# Patient Record
Sex: Female | Born: 1965 | Race: White | Hispanic: No | Smoking: Current every day smoker
Health system: Southern US, Community
[De-identification: ages and names within clinical notes are randomized; demographics above are authoritative.]

## PROBLEM LIST (undated history)

## (undated) DIAGNOSIS — F32A Depression, unspecified: Secondary | ICD-10-CM

## (undated) DIAGNOSIS — F329 Major depressive disorder, single episode, unspecified: Secondary | ICD-10-CM

## (undated) DIAGNOSIS — Z9109 Other allergy status, other than to drugs and biological substances: Secondary | ICD-10-CM

## (undated) DIAGNOSIS — K439 Ventral hernia without obstruction or gangrene: Secondary | ICD-10-CM

## (undated) DIAGNOSIS — F17200 Nicotine dependence, unspecified, uncomplicated: Secondary | ICD-10-CM

## (undated) HISTORY — PX: TONSILLECTOMY: SUR1361

## (undated) HISTORY — PX: BACK SURGERY: SHX140

---

## 2000-02-04 ENCOUNTER — Ambulatory Visit (HOSPITAL_BASED_OUTPATIENT_CLINIC_OR_DEPARTMENT_OTHER): Admission: RE | Admit: 2000-02-04 | Discharge: 2000-02-04 | Payer: Self-pay | Admitting: Orthopedic Surgery

## 2001-11-11 ENCOUNTER — Encounter: Admission: RE | Admit: 2001-11-11 | Discharge: 2001-11-11 | Payer: Self-pay | Admitting: Family Medicine

## 2001-11-11 ENCOUNTER — Encounter: Payer: Self-pay | Admitting: Family Medicine

## 2003-03-13 ENCOUNTER — Encounter: Payer: Self-pay | Admitting: Family Medicine

## 2003-03-13 ENCOUNTER — Ambulatory Visit (HOSPITAL_COMMUNITY): Admission: RE | Admit: 2003-03-13 | Discharge: 2003-03-13 | Payer: Self-pay | Admitting: Family Medicine

## 2003-11-29 ENCOUNTER — Emergency Department (HOSPITAL_COMMUNITY): Admission: EM | Admit: 2003-11-29 | Discharge: 2003-11-29 | Payer: Self-pay | Admitting: Emergency Medicine

## 2005-01-23 ENCOUNTER — Ambulatory Visit (HOSPITAL_COMMUNITY): Admission: RE | Admit: 2005-01-23 | Discharge: 2005-01-23 | Payer: Self-pay | Admitting: Family Medicine

## 2006-09-15 ENCOUNTER — Other Ambulatory Visit: Admission: RE | Admit: 2006-09-15 | Discharge: 2006-09-15 | Payer: Self-pay | Admitting: Obstetrics and Gynecology

## 2010-05-14 ENCOUNTER — Ambulatory Visit (HOSPITAL_COMMUNITY): Admission: RE | Admit: 2010-05-14 | Discharge: 2010-05-14 | Payer: Self-pay | Admitting: Family Medicine

## 2010-08-28 ENCOUNTER — Emergency Department (HOSPITAL_COMMUNITY)
Admission: EM | Admit: 2010-08-28 | Discharge: 2010-08-28 | Payer: Self-pay | Source: Home / Self Care | Admitting: Emergency Medicine

## 2011-03-06 LAB — DIFFERENTIAL
Basophils Relative: 0 % (ref 0–1)
Eosinophils Relative: 0 % (ref 0–5)
Lymphocytes Relative: 13 % (ref 12–46)
Neutrophils Relative %: 82 % — ABNORMAL HIGH (ref 43–77)

## 2011-03-06 LAB — CBC
Hemoglobin: 15.5 g/dL — ABNORMAL HIGH (ref 12.0–15.0)
MCV: 100.3 fL — ABNORMAL HIGH (ref 78.0–100.0)
RDW: 13.3 % (ref 11.5–15.5)

## 2011-03-06 LAB — BASIC METABOLIC PANEL
BUN: 4 mg/dL — ABNORMAL LOW (ref 6–23)
CO2: 25 mEq/L (ref 19–32)
Calcium: 9.6 mg/dL (ref 8.4–10.5)
Chloride: 102 mEq/L (ref 96–112)
Glucose, Bld: 115 mg/dL — ABNORMAL HIGH (ref 70–99)
Potassium: 3.7 mEq/L (ref 3.5–5.1)
Sodium: 136 mEq/L (ref 135–145)

## 2011-05-09 NOTE — Op Note (Signed)
[facility]  Patient:    Deborah Walton, Deborah Walton                     MRN: [id number] Proc. Date: 02/04/00 Adm. Date:  [id number] Attending:  Sypher, Douglass Rivers CC:         Katy Fitch. Sypher, Montez Hageman., M.D. (2)                           Operative Report  PREOPERATIVE DIAGNOSIS:  Left long finger A-2 ganglion pulley.  POSTOPERATIVE DIAGNOSIS:  Left long finger A-2 ganglion pulley.  OPERATION PERFORMED:  Excision of left long finger A-2 ganglion cyst.  SURGEON:  Katy Fitch. Sypher, Montez Hageman., M.D.  ASSISTANT:  Jaquelyn Bitter. Chabon, P.A.  ANESTHESIA:  0.25% Marcaine and 2% lidocaine metacarpal head level block, left ong finger supplemented by IV sedation.  SUPERVISING ANESTHESIOLOGIST:  Dr. Katrinka Blazing.  INDICATIONS FOR PROCEDURE:  The patient is a 45 year old woman who had noted an  enlarging mass along the palmar surface of her left long finger adjacent to the  webspace with the ring finger.  This was a source of pain and impaired prehension. She requested excisional biopsy.  DESCRIPTION OF PROCEDURE:  Deborah Walton was brought to the operating room and  placed in supine position on the operating table.  Following light sedation, the left arm was prepped with Betadine soap and solution and sterilely draped. Following exsanguination of the limb with an Esmarch bandage, the arterial tourniquet was inflated to 225 mmHg.  2% lidocaine and 0.25% Marcaine were infiltrated at the metacarpal head level.  When anesthesia was satisfactory, and oblique incision was fashioned in the line of a Brunner zigzag incision directly over the mass.  Subcutaneous tissues were carefully divided taking care to identify and gently retract the ulnar proper digital nerve of the long finger.  The cyst was circumferentially dissected off the flexor sheath and removed with a small portion of the sheath.  A rongeur was used to clean all synovial tissues off the sheath.  Bipolar  electrocautery was utilized to obtain hemostasis.  The wound was repaired with interrupted sutures of 5-0 nylon.  A compressive dressing was applied with Xeroflo, sterile gauze and Coban.  There were no apparent complications.  For aftercare, Ms. Paschal is given a prescription for Darvocet-N 100 one or two tables p.o. q.4-6h. p.r.n. pain.  20 tablets without refill. DD:  02/04/00 TD:  02/04/00 Job: [id number]

## 2011-07-07 DIAGNOSIS — F17200 Nicotine dependence, unspecified, uncomplicated: Secondary | ICD-10-CM

## 2011-07-07 HISTORY — DX: Nicotine dependence, unspecified, uncomplicated: F17.200

## 2013-07-06 ENCOUNTER — Encounter (HOSPITAL_COMMUNITY): Payer: Self-pay | Admitting: *Deleted

## 2013-07-06 ENCOUNTER — Emergency Department (HOSPITAL_COMMUNITY)
Admission: EM | Admit: 2013-07-06 | Discharge: 2013-07-06 | Disposition: A | Discharge status: Home / Self Care | Payer: Self-pay | Attending: Emergency Medicine | Admitting: Emergency Medicine

## 2013-07-06 ENCOUNTER — Emergency Department (HOSPITAL_COMMUNITY): Payer: Self-pay

## 2013-07-06 DIAGNOSIS — Z8719 Personal history of other diseases of the digestive system: Secondary | ICD-10-CM | POA: Insufficient documentation

## 2013-07-06 DIAGNOSIS — R109 Unspecified abdominal pain: Secondary | ICD-10-CM

## 2013-07-06 DIAGNOSIS — Z79899 Other long term (current) drug therapy: Secondary | ICD-10-CM | POA: Insufficient documentation

## 2013-07-06 DIAGNOSIS — F3289 Other specified depressive episodes: Secondary | ICD-10-CM | POA: Insufficient documentation

## 2013-07-06 DIAGNOSIS — F329 Major depressive disorder, single episode, unspecified: Secondary | ICD-10-CM | POA: Insufficient documentation

## 2013-07-06 DIAGNOSIS — R636 Underweight: Secondary | ICD-10-CM | POA: Insufficient documentation

## 2013-07-06 DIAGNOSIS — R1084 Generalized abdominal pain: Secondary | ICD-10-CM | POA: Insufficient documentation

## 2013-07-06 DIAGNOSIS — F17200 Nicotine dependence, unspecified, uncomplicated: Secondary | ICD-10-CM | POA: Insufficient documentation

## 2013-07-06 DIAGNOSIS — R7989 Other specified abnormal findings of blood chemistry: Secondary | ICD-10-CM | POA: Insufficient documentation

## 2013-07-06 HISTORY — DX: Nicotine dependence, unspecified, uncomplicated: F17.200

## 2013-07-06 HISTORY — DX: Major depressive disorder, single episode, unspecified: F32.9

## 2013-07-06 HISTORY — DX: Depression, unspecified: F32.A

## 2013-07-06 HISTORY — DX: Other allergy status, other than to drugs and biological substances: Z91.09

## 2013-07-06 LAB — CBC
Hemoglobin: 14.7 g/dL (ref 12.0–15.0)
MCH: 34.3 pg — ABNORMAL HIGH (ref 26.0–34.0)
MCHC: 35.3 g/dL (ref 30.0–36.0)
MCV: 97.2 fL (ref 78.0–100.0)
WBC: 5.2 10*3/uL (ref 4.0–10.5)

## 2013-07-06 LAB — URINALYSIS, ROUTINE W REFLEX MICROSCOPIC
Bilirubin Urine: NEGATIVE
Glucose, UA: NEGATIVE mg/dL
Nitrite: NEGATIVE
Protein, ur: NEGATIVE mg/dL

## 2013-07-06 LAB — COMPREHENSIVE METABOLIC PANEL
ALT: 60 U/L — ABNORMAL HIGH (ref 0–35)
CO2: 32 mEq/L (ref 19–32)
Creatinine, Ser: 0.72 mg/dL (ref 0.50–1.10)
GFR calc non Af Amer: >90 mL/min (ref >90)
Total Bilirubin: 0.4 mg/dL (ref 0.3–1.2)

## 2013-07-06 MED ORDER — METOCLOPRAMIDE HCL 5 MG/ML IJ SOLN
10.0000 mg | Freq: Once | INTRAMUSCULAR | Status: AC
  Administered 2013-07-06: 10 mg via INTRAVENOUS
  Filled 2013-07-06: qty 2

## 2013-07-06 MED ORDER — OMEPRAZOLE 20 MG PO CPDR: DELAYED_RELEASE_CAPSULE | ORAL | Status: DC

## 2013-07-06 MED ORDER — SODIUM CHLORIDE 0.9 % IV SOLN
1000.0000 mL | INTRAVENOUS | Status: DC
  Administered 2013-07-06: 1000 mL via INTRAVENOUS

## 2013-07-06 MED ORDER — SODIUM CHLORIDE 0.9 % IV SOLN
1000.0000 mL | Freq: Once | INTRAVENOUS | Status: AC
Start: 2013-07-06 — End: 2013-07-06
  Administered 2013-07-06: 1000 mL via INTRAVENOUS

## 2013-07-06 MED ORDER — IOHEXOL 300 MG/ML  SOLN
50.0000 mL | Freq: Once | INTRAMUSCULAR | Status: AC | PRN
  Administered 2013-07-06: 50 mL via ORAL

## 2013-07-06 MED ORDER — DIPHENHYDRAMINE HCL 50 MG/ML IJ SOLN
25.0000 mg | Freq: Once | INTRAMUSCULAR | Status: AC
  Administered 2013-07-06: 25 mg via INTRAVENOUS
  Filled 2013-07-06: qty 1

## 2013-07-06 MED ORDER — IOHEXOL 300 MG/ML  SOLN
100.0000 mL | Freq: Once | INTRAMUSCULAR | Status: AC | PRN
  Administered 2013-07-06: 100 mL via INTRAVENOUS

## 2013-07-06 NOTE — ED Provider Notes (Signed)
History  This chart was scribed for Ward Givens, MD by Bennett Scrape, ED Scribe. This patient was seen in room APA03/APA03 and the patient's care was started at 1:02 PM.  CSN: [id number] Arrival date & time 07/06/13  1115  First MD Initiated Contact with Patient 07/06/13 1302     Chief Complaint  Patient presents with  . Abdominal Pain    Patient is a 47 y.o. female presenting with abdominal pain. The history is provided by the patient. No language interpreter was used.  Abdominal Pain This is a new problem. The current episode started more than 1 week ago. The problem occurs daily. The problem has been gradually worsening. Associated symptoms include abdominal pain. Nothing aggravates the symptoms. Nothing relieves the symptoms. Treatments tried: TUMS, milk colace.    HPI Comments: DEITRA CRAINE is a 47 y.o. female who presents to the Emergency Department complaining of 2 weeks of diffuse abdominal pain that was felt intermittently as cramping but became a constant dull ache today. The pain is the most intense in the suprapubic region. She is currently undergoing menopause and attributed the pain to menopause. She denies vaginal bleeding, last menses was in February 2014. She was on [place] but has not taken it for 3 days. She reports that the pain started in the RUQ after eating eggs, ham, biscuit with strawberry jelly this morning. She reports drinking milk and taking 2 TUMS today with improvement. She has also tried colace and other OTC stool softeners since the onset for constipation with no improvement. Pt denies having prior episodes of similar symptoms. She reports that she had a h/o spastic colon as a child but denies similarities.  She denies having a h/o abdominal surgeries. She also expresses concern over an 18 lb weight loss. She states that at baseline she is 118 lbs. At last PCP appointment she was 100 lbs, so she started drinking beer. She reports that she drinks one 6 pack  daily and before that she drank one beer daily. She denies any prior diverticulosis diagnoses. She denies fever, urinary symptoms, nausea, emesis and diarrhea as associated symptoms.  PCP is Dr. Regino Schultze  Past Medical History  Diagnosis Date  . Depression   . Environmental allergies   . Smoker 07/07/11   Past Surgical History  Procedure Laterality Date  . Back surgery     No family history on file. History  Substance Use Topics  . Smoking status: Current Every Day Smoker  . Smokeless tobacco: Not on file  . Alcohol Use: Yes     Comment: Daily. 6 beers daily "to gain weight"      No OB history provided.   Review of Systems  Constitutional: Negative for fever.  Gastrointestinal: Positive for abdominal pain. Negative for nausea, vomiting and diarrhea.  Genitourinary: Negative for dysuria and frequency.  All other systems reviewed and are negative.    Allergies  Codeine and Other  Home Medications   Current Outpatient Rx  Name  Route  Sig  Dispense  Refill  . acidophilus (RISAQUAD) CAPS   Oral   Take 1 capsule by mouth daily.         . calcium carbonate (TUMS - DOSED IN MG ELEMENTAL CALCIUM) 500 MG chewable tablet   Oral   Chew 2-4 tablets by mouth every 4 (four) hours as needed for heartburn.         . Cyanocobalamin (VITAMIN B-12 CR PO)   Oral   Take 1 tablet by  mouth daily.         . diazepam (VALIUM) 5 MG tablet   Oral   Take 2.5 mg by mouth every 6 (six) hours as needed for anxiety or sleep.         Marland Kitchen levocetirizine (XYZAL) 5 MG tablet   Oral   Take 5 mg by mouth every other day.         . Pediatric Multiple Vit-C-FA (FLINSTONES GUMMIES OMEGA-3 DHA PO)   Oral   Take 1 tablet by mouth daily.         Marland Kitchen tetrahydrozoline-zinc (VISINE-AC) 0.05-0.25 % ophthalmic solution   Both Eyes   Place 2 drops into both eyes 3 (three) times daily as needed.         . venlafaxine (EFFEXOR) 75 MG tablet   Oral   Take 75 mg by mouth 2 (two) times  daily.          Triage Vitals: BP 155/98  Pulse 76  Temp(Src) 98.6 F (37 C) (Oral)  Resp 18  Ht 5\' 5"  (1.651 m)  Wt 115 lb (52.164 kg)  BMI 19.14 kg/m2  SpO2 100%  Vital signs normal    Physical Exam  Nursing note and vitals reviewed. Constitutional: She is oriented to person, place, and time.  Non-toxic appearance. She does not appear ill. No distress.  underweight  HENT:  Head: Normocephalic and atraumatic.  Right Ear: External ear normal.  Left Ear: External ear normal.  Nose: Nose normal. No mucosal edema or rhinorrhea.  Mouth/Throat: Oropharynx is clear and moist and mucous membranes are normal. No dental abscesses or edematous.  Eyes: Conjunctivae and EOM are normal. Pupils are equal, round, and reactive to light.  Neck: Normal range of motion and full passive range of motion without pain. Neck supple.  Cardiovascular: Normal rate, regular rhythm and normal heart sounds.  Exam reveals no gallop and no friction rub.   No murmur heard. Pulmonary/Chest: Effort normal and breath sounds normal. No respiratory distress. She has no wheezes. She has no rhonchi. She has no rales. She exhibits no tenderness and no crepitus.  Abdominal: Soft. Normal appearance and bowel sounds are normal. She exhibits no distension. There is tenderness. There is no rebound and no guarding.  Tender in the RUQ and in the suprapubic areas  Musculoskeletal: Normal range of motion. She exhibits no edema and no tenderness.  Moves all extremities well.   Neurological: She is alert and oriented to person, place, and time. She has normal strength. No cranial nerve deficit.  Skin: Skin is warm, dry and intact. No rash noted. No erythema. No pallor.  Psychiatric: She has a normal mood and affect. Her speech is normal and behavior is normal. Her mood appears not anxious.    ED Course  Procedures (including critical care time)  Medications  0.9 %  sodium chloride infusion (0 mLs Intravenous Stopped  07/06/13 1414)    Followed by  0.9 %  sodium chloride infusion (1,000 mLs Intravenous New Bag/Given 07/06/13 1413)  metoCLOPramide (REGLAN) injection 10 mg (10 mg Intravenous Given 07/06/13 1329)  diphenhydrAMINE (BENADRYL) injection 25 mg (25 mg Intravenous Given 07/06/13 1329)  iohexol (OMNIPAQUE) 300 MG/ML solution 50 mL (50 mLs Oral Contrast Given 07/06/13 1329)  iohexol (OMNIPAQUE) 300 MG/ML solution 100 mL (100 mLs Intravenous Contrast Given 07/06/13 1550)    DIAGNOSTIC STUDIES: Oxygen Saturation is 100% on room air, normal by my interpretation.    COORDINATION OF CARE: 1:11 PM-Discussed treatment plan which  includes medications, CT of abdomen, CBC panel, CMP and UA with pt at bedside and pt agreed to plan.   PT is feeling better, given results of her tests. Have discussed stopping drinking and to follow up with GI if her symptoms don't improve.   Results for orders placed during the hospital encounter of 07/06/13  CBC      Result Value Range   WBC 5.2  4.0 - 10.5 K/uL   RBC 4.28  3.87 - 5.11 MIL/uL   Hemoglobin 14.7  12.0 - 15.0 g/dL   HCT 16.1  09.6 - 04.5 %   MCV 97.2  78.0 - 100.0 fL   MCH 34.3 (*) 26.0 - 34.0 pg   MCHC 35.3  30.0 - 36.0 g/dL   RDW 40.9  81.1 - 91.4 %   Platelets 224  150 - 400 K/uL  URINALYSIS, ROUTINE W REFLEX MICROSCOPIC      Result Value Range   Color, Urine YELLOW  YELLOW   APPearance CLEAR  CLEAR   Specific Gravity, Urine 1.010  1.005 - 1.030   pH 7.5  5.0 - 8.0   Glucose, UA NEGATIVE  NEGATIVE mg/dL   Hgb urine dipstick NEGATIVE  NEGATIVE   Bilirubin Urine NEGATIVE  NEGATIVE   Ketones, ur NEGATIVE  NEGATIVE mg/dL   Protein, ur NEGATIVE  NEGATIVE mg/dL   Urobilinogen, UA 0.2  0.0 - 1.0 mg/dL   Nitrite NEGATIVE  NEGATIVE   Leukocytes, UA NEGATIVE  NEGATIVE  COMPREHENSIVE METABOLIC PANEL      Result Value Range   Sodium 140  135 - 145 mEq/L   Potassium 4.2  3.5 - 5.1 mEq/L   Chloride 100  96 - 112 mEq/L   CO2 32  19 - 32 mEq/L   Glucose, Bld  117 (*) 70 - 99 mg/dL   BUN 6  6 - 23 mg/dL   Creatinine, Ser 7.82  0.50 - 1.10 mg/dL   Calcium 95.6 (*) 8.4 - 10.5 mg/dL   Total Protein 7.2  6.0 - 8.3 g/dL   Albumin 4.2  3.5 - 5.2 g/dL   AST 213 (*) 0 - 37 U/L   ALT 60 (*) 0 - 35 U/L   Alkaline Phosphatase 96  39 - 117 U/L   Total Bilirubin 0.4  0.3 - 1.2 mg/dL   GFR calc non Af Amer >90  >90 mL/min   GFR calc Af Amer >90  >90 mL/min   Laboratory interpretation all normal except mild elevation of LFT's   Ct Abdomen Pelvis W Contrast  07/06/2013   *RADIOLOGY REPORT*  Clinical Data: Diffuse abdominal pain for 2 weeks  CT ABDOMEN AND PELVIS WITH CONTRAST  Technique:  Multidetector CT imaging of the abdomen and pelvis was performed following the standard protocol during bolus administration of intravenous contrast.  Contrast: 50mL OMNIPAQUE IOHEXOL 300 MG/ML  SOLN, OMNIPAQUE IOHEXOL 300 MG/ML  SOLN  Comparison: 12/8/4  Findings: The lung bases appear clear.  No pleural or pericardial effusion identified.  Mild fatty infiltration of the liver is noted.  More focal area of fatty deposition is noted around the falciform ligament.  The gallbladder appears normal.  No biliary dilatation.  Normal appearance of the pancreas.  The spleen is within normal limits.  The adrenal glands are both normal.  The right kidney appears normal.  The left kidney is also normal.  The urinary bladder is unremarkable.  Uterus and adnexal structures appear normal.  Mild calcified atherosclerotic disease affects the  abdominal aorta. There is no aneurysm.  There is no pelvic or inguinal adenopathy identified.  The stomach is within normal limits.  The small bowel loops are also within normal limits.  The appendix is visualized and appears normal.  Proximal colon is normal.  Mild wall thickening involving the sigmoid colon is noted.  This may reflect incomplete distention.  No free fluid or abnormal fluid collections identified within the abdomen or pelvis.  Visualized  osseous structures are remarkable for mild degenerative disc disease.  IMPRESSION:  1.  No acute findings identified within the abdomen or the pelvis. 2.  Mild wall thickening involving the sigmoid colon is noted. This likely reflects incomplete distention.  No secondary signs of colonic inflammation noted. 3.  The appendix is visualized and appears normal   Original Report Authenticated By: Signa Kell, M.D.      1. Abdominal pain   2. Elevated liver function tests    Discharge Medication List as of 07/06/2013  6:10 PM    START taking these medications   Details  omeprazole (PRILOSEC) 20 MG capsule Take 1 po BID x 2 weeks then once a day, Print        Plan discharge   Devoria Albe, MD, FACEP    MDM   I personally performed the services described in this documentation, which was scribed in my presence. The recorded information has been reviewed and considered.  Devoria Albe, MD, Armando Gang    Ward Givens, MD 07/06/13 [phone]

## 2013-07-06 NOTE — ED Notes (Signed)
Generalized abdominal pain x 2 weeks. This morning, pain began to RUQ. Denies N/V/D.

## 2014-06-14 IMAGING — CT CT ABD-PELV W/ CM
2 of 5 series · 16 of 46 positions shown, 18 images · IV contrast (Omnipaque 300)
Comparison: 11/07/26

CLINICAL DATA: Diffuse abdominal pain for 2 weeks

CT ABDOMEN AND PELVIS WITH CONTRAST
TECHNIQUE: Multidetector CT imaging of the abdomen and pelvis was
performed following the standard protocol during bolus
administration of intravenous contrast.
Contrast: 50mL OMNIPAQUE IOHEXOL 300 MG/ML  SOLN, 100mL OMNIPAQUE
IOHEXOL 300 MG/ML  SOLN

[Series 2: abd_pel_with 5.0 b40f · axial · 0.65mm/px · z∈[-372,-22]mm · 13 of 80 slices shown, 15 images]
[im 5/80  soft-tissue]
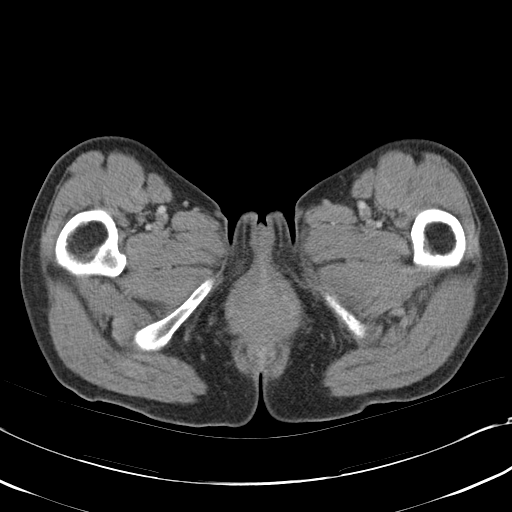
[im 5/80  bone]
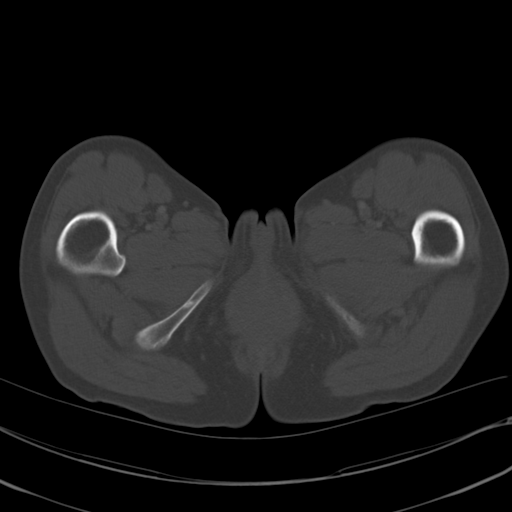
[im 13/80  soft-tissue]
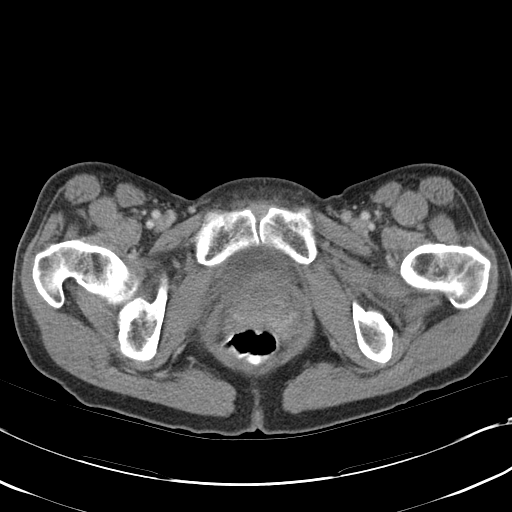
[im 17/80  soft-tissue]
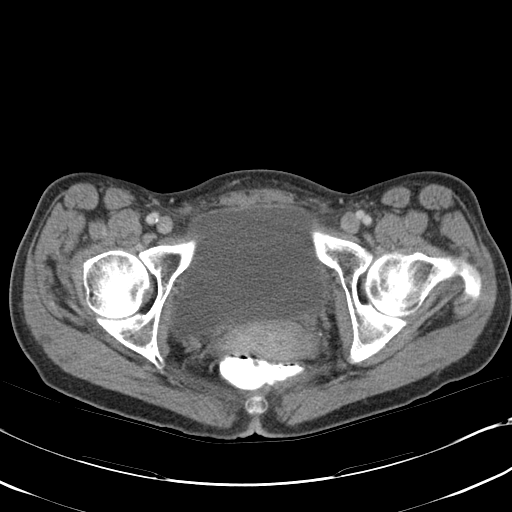
[im 21/80  soft-tissue]
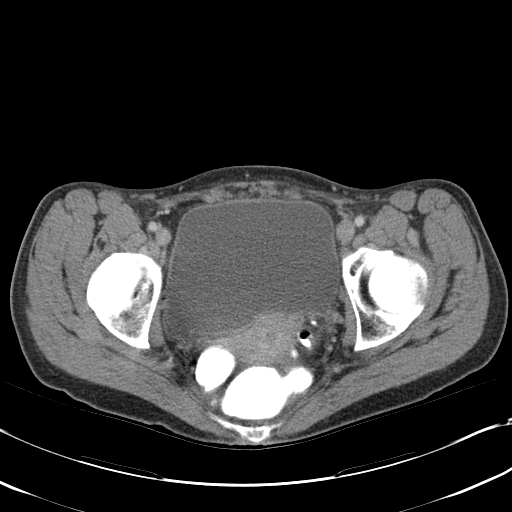
[im 30/80  soft-tissue]
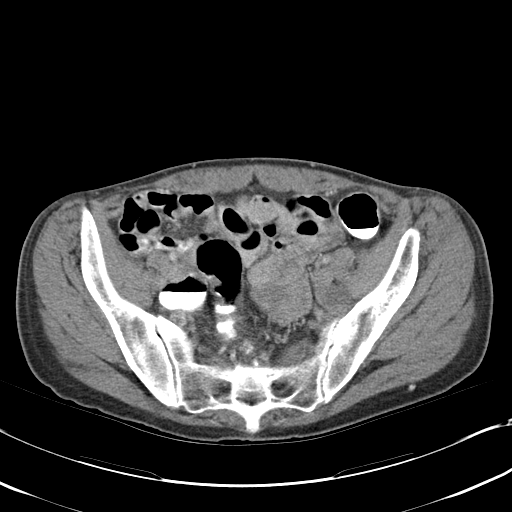
[im 34/80  soft-tissue]
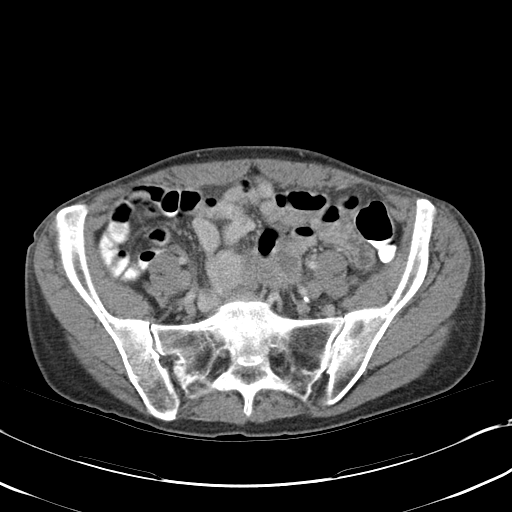
[im 42/80  soft-tissue]
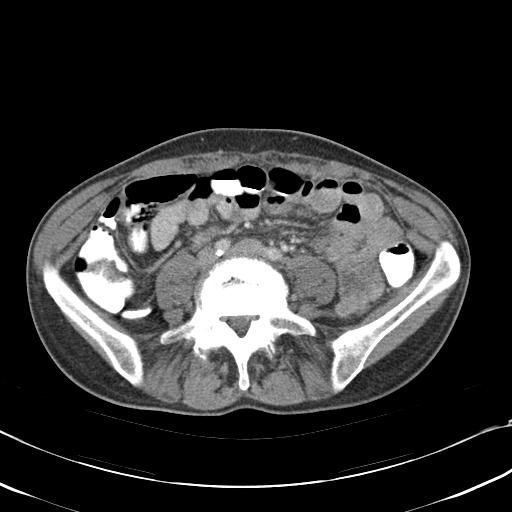
[im 46/80  soft-tissue]
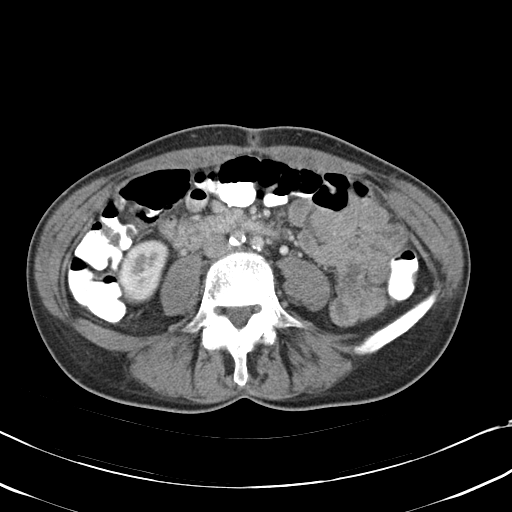
[im 50/80  soft-tissue]
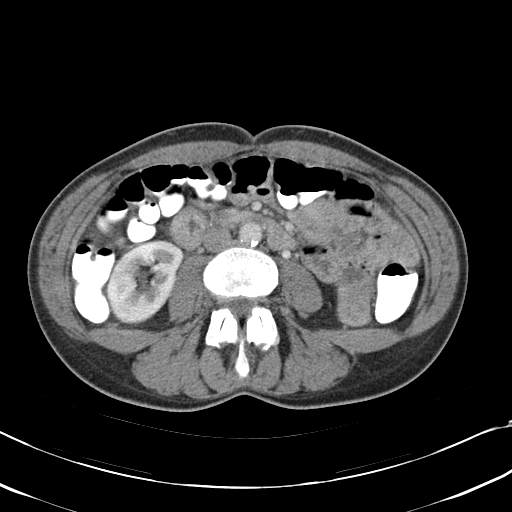
[im 50/80  bone]
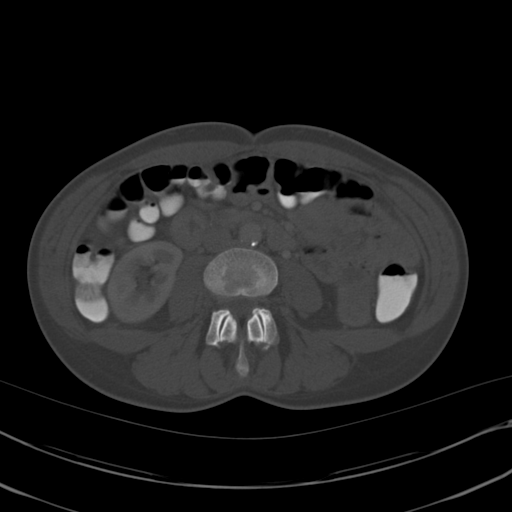
[im 59/80  soft-tissue]
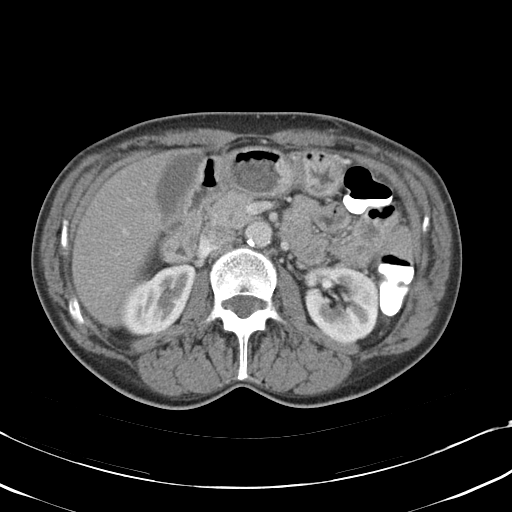
[im 63/80  soft-tissue]
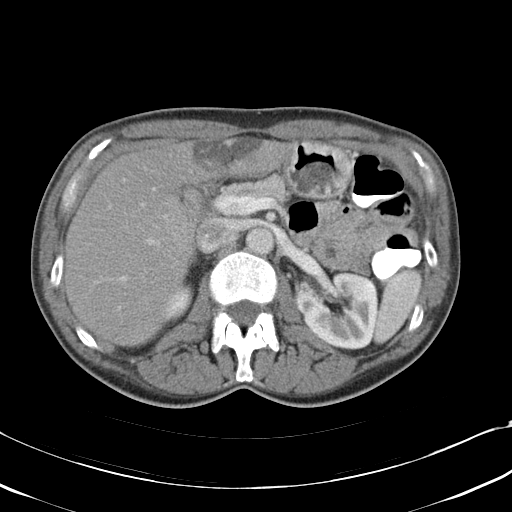
[im 67/80  soft-tissue]
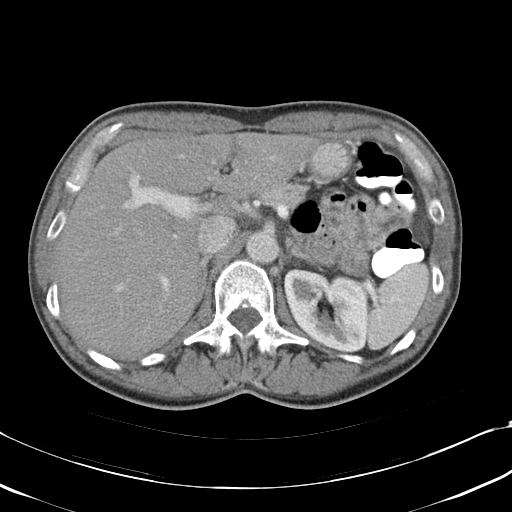
[im 75/80  soft-tissue]
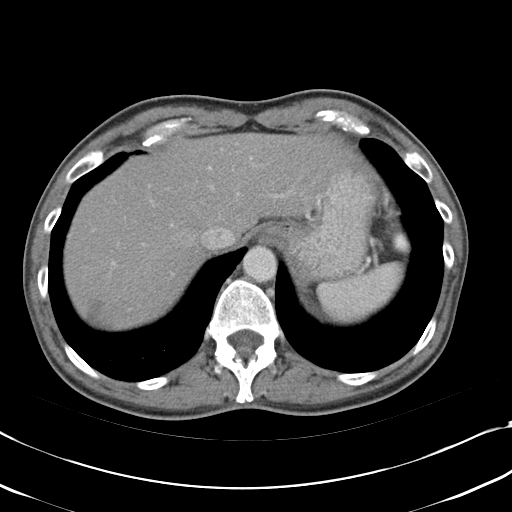

[Series 4: abd_pel_with 3.0 spo cor · coronal · 0.59mm/px · 3 of 68 slices shown]
[im 23/68  soft-tissue]
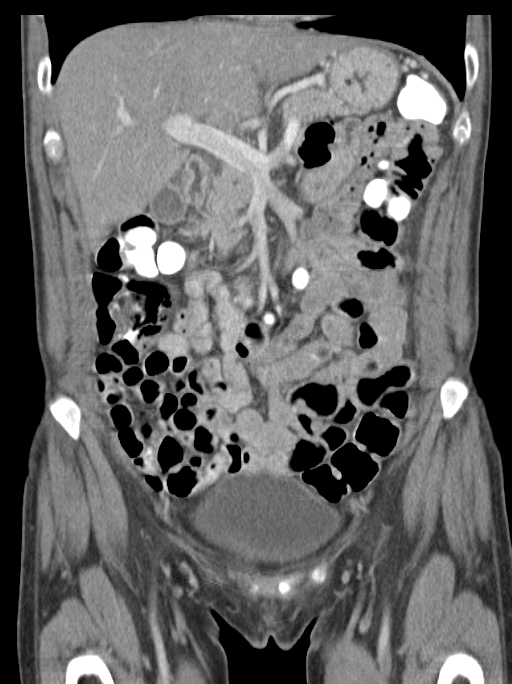
[im 30/68  soft-tissue]
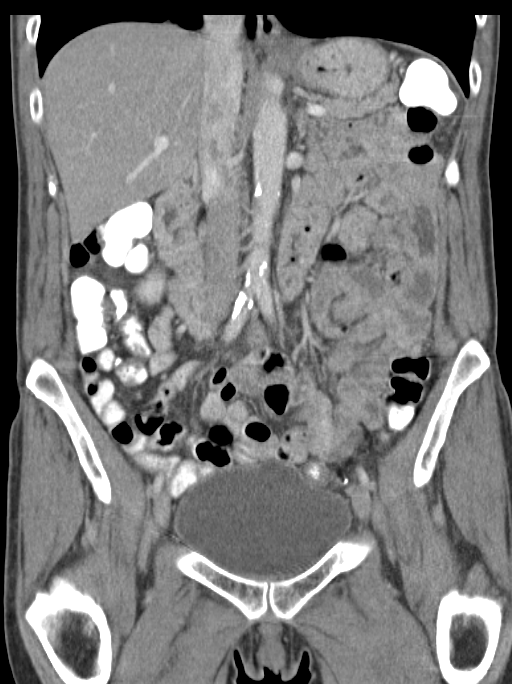
[im 38/68  soft-tissue]
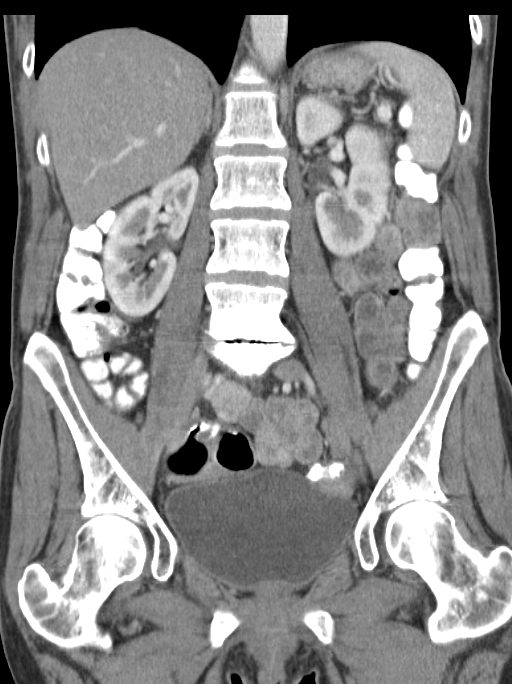

[16 of 46 positions shown; findings below may reference images not displayed]

FINDINGS: The lung bases appear clear.  No pleural or pericardial
effusion identified.

Mild fatty infiltration of the liver is noted.  More focal area of
fatty deposition is noted around the falciform ligament.  The
gallbladder appears normal.  No biliary dilatation.  Normal
appearance of the pancreas.  The spleen is within normal limits.

The adrenal glands are both normal.  The right kidney appears
normal.  The left kidney is also normal.  The urinary bladder is
unremarkable.  Uterus and adnexal structures appear normal.

Mild calcified atherosclerotic disease affects the abdominal aorta.
There is no aneurysm.  There is no pelvic or inguinal adenopathy
identified.

The stomach is within normal limits.  The small bowel loops are
also within normal limits.  The appendix is visualized and appears
normal.  Proximal colon is normal.  Mild wall thickening involving
the sigmoid colon is noted.  This may reflect incomplete
distention.

No free fluid or abnormal fluid collections identified within the
abdomen or pelvis.  Visualized osseous structures are remarkable
for mild degenerative disc disease.
IMPRESSION: 1.  No acute findings identified within the abdomen or the pelvis.
2.  Mild wall thickening involving the sigmoid colon is noted.
This likely reflects incomplete distention.  No secondary signs of
colonic inflammation noted.
3.  The appendix is visualized and appears normal

## 2018-09-30 DIAGNOSIS — M25559 Pain in unspecified hip: Secondary | ICD-10-CM | POA: Insufficient documentation

## 2018-09-30 DIAGNOSIS — M51369 Other intervertebral disc degeneration, lumbar region without mention of lumbar back pain or lower extremity pain: Secondary | ICD-10-CM | POA: Insufficient documentation

## 2020-01-31 ENCOUNTER — Other Ambulatory Visit: Payer: Self-pay

## 2020-01-31 ENCOUNTER — Ambulatory Visit: Payer: Self-pay | Attending: Internal Medicine

## 2020-01-31 DIAGNOSIS — Z20822 Contact with and (suspected) exposure to covid-19: Secondary | ICD-10-CM | POA: Insufficient documentation

## 2020-02-01 LAB — NOVEL CORONAVIRUS, NAA: SARS-CoV-2, NAA: NOT DETECTED

## 2021-03-11 ENCOUNTER — Other Ambulatory Visit: Payer: Self-pay | Admitting: General Surgery

## 2021-03-11 DIAGNOSIS — K439 Ventral hernia without obstruction or gangrene: Secondary | ICD-10-CM

## 2021-03-26 ENCOUNTER — Ambulatory Visit
Admission: RE | Admit: 2021-03-26 | Discharge: 2021-03-26 | Disposition: A | Discharge status: Home / Self Care | Payer: 59 | Source: Ambulatory Visit | Attending: General Surgery | Admitting: General Surgery

## 2021-03-26 DIAGNOSIS — K439 Ventral hernia without obstruction or gangrene: Secondary | ICD-10-CM

## 2021-03-26 MED ORDER — IOPAMIDOL (ISOVUE-300) INJECTION 61%
100.0000 mL | Freq: Once | INTRAVENOUS | Status: AC | PRN
  Administered 2021-03-26: 100 mL via INTRAVENOUS

## 2021-04-25 ENCOUNTER — Other Ambulatory Visit (HOSPITAL_COMMUNITY): Payer: Self-pay | Admitting: Neurosurgery

## 2021-04-25 ENCOUNTER — Other Ambulatory Visit: Payer: Self-pay | Admitting: Neurosurgery

## 2021-04-25 DIAGNOSIS — M5441 Lumbago with sciatica, right side: Secondary | ICD-10-CM

## 2021-04-26 ENCOUNTER — Encounter (HOSPITAL_COMMUNITY): Payer: Self-pay | Admitting: *Deleted

## 2021-04-26 ENCOUNTER — Inpatient Hospital Stay (HOSPITAL_COMMUNITY)
Admission: EM | Admit: 2021-04-26 | Discharge: 2021-04-27 | DRG: 378 | Discharge status: Against Medical Advice | Payer: 59 | Attending: Internal Medicine | Admitting: Internal Medicine

## 2021-04-26 ENCOUNTER — Emergency Department (HOSPITAL_COMMUNITY): Payer: 59

## 2021-04-26 ENCOUNTER — Other Ambulatory Visit: Payer: Self-pay

## 2021-04-26 DIAGNOSIS — K219 Gastro-esophageal reflux disease without esophagitis: Secondary | ICD-10-CM | POA: Diagnosis present

## 2021-04-26 DIAGNOSIS — Z789 Other specified health status: Secondary | ICD-10-CM

## 2021-04-26 DIAGNOSIS — D649 Anemia, unspecified: Secondary | ICD-10-CM

## 2021-04-26 DIAGNOSIS — E876 Hypokalemia: Secondary | ICD-10-CM | POA: Diagnosis present

## 2021-04-26 DIAGNOSIS — E46 Unspecified protein-calorie malnutrition: Secondary | ICD-10-CM | POA: Diagnosis present

## 2021-04-26 DIAGNOSIS — K921 Melena: Principal | ICD-10-CM | POA: Diagnosis present

## 2021-04-26 DIAGNOSIS — Z20822 Contact with and (suspected) exposure to covid-19: Secondary | ICD-10-CM | POA: Diagnosis present

## 2021-04-26 DIAGNOSIS — E871 Hypo-osmolality and hyponatremia: Secondary | ICD-10-CM | POA: Diagnosis present

## 2021-04-26 DIAGNOSIS — D509 Iron deficiency anemia, unspecified: Secondary | ICD-10-CM | POA: Diagnosis not present

## 2021-04-26 DIAGNOSIS — F32A Depression, unspecified: Secondary | ICD-10-CM | POA: Diagnosis present

## 2021-04-26 DIAGNOSIS — F1020 Alcohol dependence, uncomplicated: Secondary | ICD-10-CM | POA: Diagnosis present

## 2021-04-26 DIAGNOSIS — Z7289 Other problems related to lifestyle: Secondary | ICD-10-CM

## 2021-04-26 DIAGNOSIS — F17210 Nicotine dependence, cigarettes, uncomplicated: Secondary | ICD-10-CM | POA: Diagnosis present

## 2021-04-26 DIAGNOSIS — K922 Gastrointestinal hemorrhage, unspecified: Secondary | ICD-10-CM | POA: Diagnosis not present

## 2021-04-26 DIAGNOSIS — F1090 Alcohol use, unspecified, uncomplicated: Secondary | ICD-10-CM

## 2021-04-26 DIAGNOSIS — Z79899 Other long term (current) drug therapy: Secondary | ICD-10-CM

## 2021-04-26 DIAGNOSIS — J449 Chronic obstructive pulmonary disease, unspecified: Secondary | ICD-10-CM | POA: Diagnosis present

## 2021-04-26 DIAGNOSIS — R531 Weakness: Secondary | ICD-10-CM | POA: Diagnosis present

## 2021-04-26 DIAGNOSIS — Z791 Long term (current) use of non-steroidal anti-inflammatories (NSAID): Secondary | ICD-10-CM

## 2021-04-26 DIAGNOSIS — Z681 Body mass index (BMI) 19 or less, adult: Secondary | ICD-10-CM | POA: Diagnosis not present

## 2021-04-26 DIAGNOSIS — K3189 Other diseases of stomach and duodenum: Secondary | ICD-10-CM | POA: Diagnosis not present

## 2021-04-26 DIAGNOSIS — Z720 Tobacco use: Secondary | ICD-10-CM | POA: Diagnosis present

## 2021-04-26 DIAGNOSIS — D500 Iron deficiency anemia secondary to blood loss (chronic): Secondary | ICD-10-CM | POA: Diagnosis present

## 2021-04-26 DIAGNOSIS — J4489 Other specified chronic obstructive pulmonary disease: Secondary | ICD-10-CM | POA: Diagnosis present

## 2021-04-26 HISTORY — DX: Ventral hernia without obstruction or gangrene: K43.9

## 2021-04-26 LAB — BASIC METABOLIC PANEL
Anion gap: 13 (ref 5–15)
Anion gap: 9 (ref 5–15)
BUN: 5 mg/dL — ABNORMAL LOW (ref 6–20)
BUN: 5 mg/dL — ABNORMAL LOW (ref 6–20)
CO2: 24 mmol/L (ref 22–32)
CO2: 24 mmol/L (ref 22–32)
Calcium: 8.8 mg/dL — ABNORMAL LOW (ref 8.9–10.3)
Calcium: 8.7 mg/dL — ABNORMAL LOW (ref 8.9–10.3)
Chloride: 79 mmol/L — ABNORMAL LOW (ref 98–111)
Chloride: 85 mmol/L — ABNORMAL LOW (ref 98–111)
Creatinine, Ser: 0.54 mg/dL (ref 0.44–1.00)
Creatinine, Ser: 0.63 mg/dL (ref 0.44–1.00)
GFR, Estimated: >60 mL/min (ref >60)
GFR, Estimated: >60 mL/min (ref >60)
Glucose, Bld: 95 mg/dL (ref 70–99)
Glucose, Bld: 113 mg/dL — ABNORMAL HIGH (ref 70–99)
Potassium: 3 mmol/L — ABNORMAL LOW (ref 3.5–5.1)
Potassium: 2.9 mmol/L — ABNORMAL LOW (ref 3.5–5.1)
Sodium: 116 mmol/L — CL (ref 135–145)
Sodium: 118 mmol/L — CL (ref 135–145)

## 2021-04-26 LAB — URINALYSIS, ROUTINE W REFLEX MICROSCOPIC
Bilirubin Urine: NEGATIVE
Glucose, UA: NEGATIVE mg/dL
Hgb urine dipstick: NEGATIVE
Ketones, ur: 5 mg/dL — AB
Nitrite: NEGATIVE
Protein, ur: NEGATIVE mg/dL
Specific Gravity, Urine: 1.004 — ABNORMAL LOW (ref 1.005–1.030)
pH: 6 (ref 5.0–8.0)

## 2021-04-26 LAB — POC OCCULT BLOOD, ED: Fecal Occult Bld: POSITIVE — AB

## 2021-04-26 LAB — CBC WITH DIFFERENTIAL/PLATELET
Abs Immature Granulocytes: 0.04 10*3/uL (ref 0.00–0.07)
Basophils Absolute: 0 10*3/uL (ref 0.0–0.1)
Basophils Relative: 0 %
Eosinophils Absolute: 0 10*3/uL (ref 0.0–0.5)
Eosinophils Relative: 0 %
HCT: 21.7 % — ABNORMAL LOW (ref 36.0–46.0)
Hemoglobin: 6.8 g/dL — CL (ref 12.0–15.0)
Immature Granulocytes: 1 %
Lymphocytes Relative: 19 %
Lymphs Abs: 1 10*3/uL (ref 0.7–4.0)
MCH: 21.3 pg — ABNORMAL LOW (ref 26.0–34.0)
MCHC: 31.3 g/dL (ref 30.0–36.0)
MCV: 68 fL — ABNORMAL LOW (ref 80.0–100.0)
Monocytes Absolute: 0.6 10*3/uL (ref 0.1–1.0)
Monocytes Relative: 11 %
Neutro Abs: 3.6 10*3/uL (ref 1.7–7.7)
Neutrophils Relative %: 69 %
Platelets: 330 10*3/uL (ref 150–400)
RBC: 3.19 MIL/uL — ABNORMAL LOW (ref 3.87–5.11)
RDW: 17.2 % — ABNORMAL HIGH (ref 11.5–15.5)
WBC: 5.2 10*3/uL (ref 4.0–10.5)
nRBC: 0 % (ref 0.0–0.2)

## 2021-04-26 LAB — COMPREHENSIVE METABOLIC PANEL
ALT: 30 U/L (ref 0–44)
AST: 43 U/L — ABNORMAL HIGH (ref 15–41)
Albumin: 4.3 g/dL (ref 3.5–5.0)
Alkaline Phosphatase: 59 U/L (ref 38–126)
Anion gap: 10 (ref 5–15)
BUN: 5 mg/dL — ABNORMAL LOW (ref 6–20)
CO2: 24 mmol/L (ref 22–32)
Calcium: 8.6 mg/dL — ABNORMAL LOW (ref 8.9–10.3)
Chloride: 77 mmol/L — ABNORMAL LOW (ref 98–111)
Creatinine, Ser: 0.56 mg/dL (ref 0.44–1.00)
GFR, Estimated: >60 mL/min (ref >60)
Glucose, Bld: 104 mg/dL — ABNORMAL HIGH (ref 70–99)
Potassium: 3.1 mmol/L — ABNORMAL LOW (ref 3.5–5.1)
Sodium: 111 mmol/L — CL (ref 135–145)
Total Bilirubin: 0.5 mg/dL (ref 0.3–1.2)
Total Protein: 6.7 g/dL (ref 6.5–8.1)

## 2021-04-26 LAB — RESP PANEL BY RT-PCR (FLU A&B, COVID) ARPGX2
Influenza A by PCR: NEGATIVE
Influenza B by PCR: NEGATIVE
SARS Coronavirus 2 by RT PCR: NEGATIVE

## 2021-04-26 LAB — PROTIME-INR
INR: 1 (ref 0.8–1.2)
Prothrombin Time: 12.7 seconds (ref 11.4–15.2)

## 2021-04-26 LAB — PHOSPHORUS: Phosphorus: 3.2 mg/dL (ref 2.5–4.6)

## 2021-04-26 LAB — ETHANOL: Alcohol, Ethyl (B): <10 mg/dL (ref <10)

## 2021-04-26 LAB — LIPASE, BLOOD: Lipase: 28 U/L (ref 11–51)

## 2021-04-26 LAB — ABO/RH: ABO/RH(D): B POS

## 2021-04-26 LAB — APTT: aPTT: 26 seconds (ref 24–36)

## 2021-04-26 LAB — MAGNESIUM: Magnesium: 2 mg/dL (ref 1.7–2.4)

## 2021-04-26 LAB — PREPARE RBC (CROSSMATCH)

## 2021-04-26 MED ORDER — THIAMINE HCL 100 MG/ML IJ SOLN: 100.0000 mg | Freq: Every day | INTRAMUSCULAR | Status: DC

## 2021-04-26 MED ORDER — POTASSIUM CHLORIDE CRYS ER 20 MEQ PO TBCR
40.0000 meq | EXTENDED_RELEASE_TABLET | Freq: Two times a day (BID) | ORAL | Status: DC
  Administered 2021-04-26 – 2021-04-27 (×2): 40 meq via ORAL
  Filled 2021-04-26 (×2): qty 2

## 2021-04-26 MED ORDER — DIAZEPAM 5 MG PO TABS: 2.5000 mg | ORAL_TABLET | Freq: Four times a day (QID) | ORAL | Status: DC | PRN

## 2021-04-26 MED ORDER — DILTIAZEM HCL ER COATED BEADS 240 MG PO CP24
240.0000 mg | ORAL_CAPSULE | Freq: Every day | ORAL | Status: DC
  Administered 2021-04-27: 240 mg via ORAL
  Filled 2021-04-26: qty 1

## 2021-04-26 MED ORDER — POTASSIUM CHLORIDE 10 MEQ/100ML IV SOLN
10.0000 meq | INTRAVENOUS | Status: DC
  Administered 2021-04-26: 10 meq via INTRAVENOUS
  Filled 2021-04-26: qty 100

## 2021-04-26 MED ORDER — CITALOPRAM HYDROBROMIDE 20 MG PO TABS
20.0000 mg | ORAL_TABLET | Freq: Every day | ORAL | Status: DC
  Administered 2021-04-27: 20 mg via ORAL
  Filled 2021-04-26: qty 1

## 2021-04-26 MED ORDER — LORATADINE 10 MG PO TABS
10.0000 mg | ORAL_TABLET | Freq: Every day | ORAL | Status: DC
  Administered 2021-04-27: 10 mg via ORAL
  Filled 2021-04-26: qty 1

## 2021-04-26 MED ORDER — ALBUTEROL SULFATE HFA 108 (90 BASE) MCG/ACT IN AERS: 2.0000 | INHALATION_SPRAY | RESPIRATORY_TRACT | Status: DC | PRN

## 2021-04-26 MED ORDER — SODIUM CHLORIDE 0.9 % IV SOLN
80.0000 mg | Freq: Once | INTRAVENOUS | Status: AC
  Administered 2021-04-26: 80 mg via INTRAVENOUS
  Filled 2021-04-26: qty 80

## 2021-04-26 MED ORDER — POTASSIUM CHLORIDE CRYS ER 20 MEQ PO TBCR
40.0000 meq | EXTENDED_RELEASE_TABLET | Freq: Once | ORAL | Status: AC
  Administered 2021-04-26: 40 meq via ORAL
  Filled 2021-04-26: qty 2

## 2021-04-26 MED ORDER — FOLIC ACID 1 MG PO TABS
1.0000 mg | ORAL_TABLET | Freq: Every day | ORAL | Status: DC
  Administered 2021-04-27: 1 mg via ORAL
  Filled 2021-04-26: qty 1

## 2021-04-26 MED ORDER — ENSURE ENLIVE PO LIQD
237.0000 mL | Freq: Two times a day (BID) | ORAL | Status: DC
  Administered 2021-04-27: 237 mL via ORAL

## 2021-04-26 MED ORDER — LORAZEPAM 2 MG/ML IJ SOLN: 1.0000 mg | INTRAMUSCULAR | Status: DC | PRN

## 2021-04-26 MED ORDER — NICOTINE POLACRILEX 2 MG MT GUM
2.0000 mg | CHEWING_GUM | OROMUCOSAL | Status: DC | PRN
  Filled 2021-04-26: qty 1

## 2021-04-26 MED ORDER — OXYCODONE HCL 5 MG PO TABS: 5.0000 mg | ORAL_TABLET | ORAL | Status: DC | PRN

## 2021-04-26 MED ORDER — THIAMINE HCL 100 MG/ML IJ SOLN
100.0000 mg | Freq: Every day | INTRAMUSCULAR | Status: DC
  Administered 2021-04-26: 100 mg via INTRAVENOUS
  Filled 2021-04-26 (×2): qty 2

## 2021-04-26 MED ORDER — NAPHAZOLINE-GLYCERIN 0.012-0.25 % OP SOLN: 1.0000 [drp] | Freq: Four times a day (QID) | OPHTHALMIC | Status: DC | PRN

## 2021-04-26 MED ORDER — ONDANSETRON HCL 4 MG PO TABS
4.0000 mg | ORAL_TABLET | Freq: Four times a day (QID) | ORAL | Status: DC | PRN
Start: 2021-04-26 — End: 2021-04-27

## 2021-04-26 MED ORDER — SODIUM CHLORIDE 0.9 % IV SOLN: 1.0000 mg | Freq: Once | INTRAVENOUS | Status: DC

## 2021-04-26 MED ORDER — SODIUM CHLORIDE 0.9% IV SOLUTION: INTRAVENOUS | Status: DC

## 2021-04-26 MED ORDER — PANTOPRAZOLE SODIUM 40 MG IV SOLR: 40.0000 mg | Freq: Two times a day (BID) | INTRAVENOUS | Status: DC

## 2021-04-26 MED ORDER — THIAMINE HCL 100 MG PO TABS
100.0000 mg | ORAL_TABLET | Freq: Every day | ORAL | Status: DC
  Administered 2021-04-27: 100 mg via ORAL
  Filled 2021-04-26: qty 1

## 2021-04-26 MED ORDER — THIAMINE HCL 100 MG PO TABS: 100.0000 mg | ORAL_TABLET | Freq: Every day | ORAL | Status: DC

## 2021-04-26 MED ORDER — LORAZEPAM 1 MG PO TABS: 1.0000 mg | ORAL_TABLET | ORAL | Status: DC | PRN

## 2021-04-26 MED ORDER — SODIUM CHLORIDE 0.9 % IV SOLN
8.0000 mg/h | INTRAVENOUS | Status: DC
  Administered 2021-04-26 – 2021-04-27 (×2): 8 mg/h via INTRAVENOUS
  Filled 2021-04-26 (×8): qty 80

## 2021-04-26 MED ORDER — FOLIC ACID 5 MG/ML IJ SOLN
1.0000 mg | Freq: Once | INTRAMUSCULAR | Status: AC
  Administered 2021-04-26: 1 mg via INTRAVENOUS
  Filled 2021-04-26: qty 0.2

## 2021-04-26 MED ORDER — ONDANSETRON HCL 4 MG/2ML IJ SOLN: 4.0000 mg | Freq: Four times a day (QID) | INTRAMUSCULAR | Status: DC | PRN

## 2021-04-26 MED ORDER — ADULT MULTIVITAMIN W/MINERALS CH
1.0000 | ORAL_TABLET | Freq: Every day | ORAL | Status: DC
  Administered 2021-04-27: 1 via ORAL
  Filled 2021-04-26: qty 1

## 2021-04-26 NOTE — ED Provider Notes (Signed)
[facility] Provider Note   CSN: [id number] Arrival date & time: 04/26/21  1418     History Chief Complaint  Patient presents with  . Abnormal Lab    Hgb 7    Deborah Walton is a 55 y.o. female.  HPI    This patient is a 55 year old female, she has a history of chronic tobacco use, history of an abdominal wall hernia as well as a history of depression.  She does take venlafaxine, she also takes medicine for allergies and Prilosec occasionally for acid reflux.  She had presented to her doctor's office because she was having some abdominal pain, some vomiting and some changes in her bowel habits, she had been feeling generally weak.  They had prescribed her some kind of medication telling her that she had a virus and states that she received an injection in each of her hips before she left the office.  She was called today by the office to tell her to come immediately to the emergency department because of abnormal blood work.  She states that the generalized weakness is persistent, gradually worsening, she states that she was too weak to come by herself and had to have a family member bring her.  She denies changes in vision, states that she has been having epigastric discomfort and her bowel movements have been dark the last couple of days though she does state that she has been taking Pepto-Bismol.  She thinks that she has a history of a stomach ulcer but cannot remember when she was diagnosed.  She does not take her acid reflux medication regularly.  She has been using more asa lately due to pain.  Past Medical History:  Diagnosis Date  . Depression   . Environmental allergies   . Hernia of abdominal wall   . Smoker 07/07/11    There are no problems to display for this patient.   Past Surgical History:  Procedure Laterality Date  . BACK SURGERY    . TONSILLECTOMY       OB History   No obstetric history on file.     History reviewed. No pertinent  family history.  Social History   Tobacco Use  . Smoking status: Current Every Day Smoker    Packs/day: 1.50  . Smokeless tobacco: Never Used  Vaping Use  . Vaping Use: Former  Substance Use Topics  . Alcohol use: Yes    Comment: Daily. 6 beers daily "to gain weight"   . Drug use: No    Home Medications Prior to Admission medications   Medication Sig Start Date End Date Taking? Authorizing Provider  albuterol (VENTOLIN HFA) 108 (90 Base) MCG/ACT inhaler Inhale 2 puffs into the lungs every 4 (four) hours as needed. 01/14/21  Yes [provider]  calcium carbonate (TUMS - DOSED IN MG ELEMENTAL CALCIUM) 500 MG chewable tablet Chew 2-4 tablets by mouth every 4 (four) hours as needed for heartburn.   Yes [provider]  citalopram (CELEXA) 20 MG tablet Take 20 mg by mouth daily.   Yes [provider]  Cyanocobalamin (VITAMIN B-12 CR PO) Take 1 tablet by mouth daily.   Yes [provider]  diazepam (VALIUM) 5 MG tablet Take 2.5 mg by mouth every 6 (six) hours as needed for anxiety or sleep.   Yes [provider]  diltiazem (CARDIZEM CD) 240 MG 24 hr capsule Take 240 mg by mouth daily. 03/21/21  Yes [provider]  ibuprofen (ADVIL)  100 MG tablet Take 400 mg by mouth every 6 (six) hours as needed for fever.   Yes [provider]  loratadine (CLARITIN) 10 MG tablet Take 10 mg by mouth daily.   Yes [provider]  ondansetron (ZOFRAN) 4 MG tablet Take 4 mg by mouth every 6 (six) hours as needed. 04/25/21  Yes [provider]  tetrahydrozoline-zinc (VISINE-AC) 0.05-0.25 % ophthalmic solution Place 2 drops into both eyes 3 (three) times daily as needed.   Yes [provider]  acidophilus (RISAQUAD) CAPS Take 1 capsule by mouth daily. Patient not taking: Reported on 04/26/2021    [provider]  levocetirizine (XYZAL) 5 MG tablet Take 5 mg by mouth every other day. Patient not taking: Reported on  04/26/2021    [provider]  omeprazole (PRILOSEC) 20 MG capsule Take 1 po BID x 2 weeks then once a day Patient not taking: Reported on 04/26/2021 07/06/13   Devoria Albe, MD  Pediatric Multiple Vit-C-FA (FLINSTONES GUMMIES OMEGA-3 DHA PO) Take 1 tablet by mouth daily. Patient not taking: Reported on 04/26/2021    [provider]  venlafaxine (EFFEXOR) 75 MG tablet Take 75 mg by mouth 2 (two) times daily. Patient not taking: Reported on 04/26/2021    [provider]    Allergies    Codeine and Other  Review of Systems   Review of Systems  All other systems reviewed and are negative.   Physical Exam Updated Vital Signs BP (!) 126/58   Pulse 63   Temp 97.6 F (36.4 C) (Oral)   Resp 11   SpO2 100%   Physical Exam Vitals and nursing note reviewed.  Constitutional:      General: She is not in acute distress.    Appearance: She is well-developed. She is ill-appearing.  HENT:     Head: Normocephalic and atraumatic.     Mouth/Throat:     Pharynx: No oropharyngeal exudate.     Comments: Pale mucous membranes Eyes:     General: No scleral icterus.       Right eye: No discharge.        Left eye: No discharge.     Pupils: Pupils are equal, round, and reactive to light.     Comments: Pale conjunctive a  Neck:     Thyroid: No thyromegaly.     Vascular: No JVD.  Cardiovascular:     Rate and Rhythm: Normal rate and regular rhythm.     Heart sounds: Normal heart sounds. No murmur heard. No friction rub. No gallop.   Pulmonary:     Effort: Pulmonary effort is normal. No respiratory distress.     Breath sounds: Normal breath sounds. No wheezing or rales.  Abdominal:     General: Bowel sounds are normal. There is no distension.     Palpations: Abdomen is soft. There is no mass.     Tenderness: There is abdominal tenderness.     Comments: No hernia over examination of the abdomen and inguinal regions.  She states it is in the left upper quadrant and is not poking  out right now, mild generalized upper abdominal tenderness  Musculoskeletal:        General: No tenderness. Normal range of motion.     Cervical back: Normal range of motion and neck supple.     Right lower leg: No edema.     Left lower leg: No edema.  Lymphadenopathy:     Cervical: No cervical adenopathy.  Skin:  General: Skin is warm and dry.     Findings: No erythema or rash.     Comments: Pale  Neurological:     Mental Status: She is alert.     Coordination: Coordination normal.     Comments: Generally weak but able to follow commands and answer my questions  Psychiatric:        Behavior: Behavior normal.     ED Results / Procedures / Treatments   Labs (all labs ordered are listed, but only abnormal results are displayed) Labs Reviewed  CBC WITH DIFFERENTIAL/PLATELET - Abnormal; Notable for the following components:      Result Value   RBC 3.19 (*)    Hemoglobin 6.8 (*)    HCT 21.7 (*)    MCV 68.0 (*)    MCH 21.3 (*)    RDW 17.2 (*)    All other components within normal limits  COMPREHENSIVE METABOLIC PANEL - Abnormal; Notable for the following components:   Sodium 111 (*)    Potassium 3.1 (*)    Chloride 77 (*)    Glucose, Bld 104 (*)    BUN 5 (*)    Calcium 8.6 (*)    AST 43 (*)    All other components within normal limits  URINALYSIS, ROUTINE W REFLEX MICROSCOPIC - Abnormal; Notable for the following components:   Color, Urine STRAW (*)    Specific Gravity, Urine 1.004 (*)    Ketones, ur 5 (*)    Leukocytes,Ua TRACE (*)    Bacteria, UA RARE (*)    All other components within normal limits  POC OCCULT BLOOD, ED - Abnormal; Notable for the following components:   Fecal Occult Bld POSITIVE (*)    All other components within normal limits  RESP PANEL BY RT-PCR (FLU A&B, COVID) ARPGX2  LIPASE, BLOOD  PROTIME-INR  APTT  ETHANOL  TYPE AND SCREEN  ABO/RH  PREPARE RBC (CROSSMATCH)    EKG None  Radiology DG Chest Portable 1 View  Result Date:  04/26/2021 CLINICAL DATA:  Cough. EXAM: PORTABLE CHEST 1 VIEW COMPARISON:  May 14, 2010 FINDINGS: The lungs are hyperinflated. Mild, biapical atelectasis is noted. There is no evidence of acute infiltrate, pleural effusion or pneumothorax. The heart size and mediastinal contours are within normal limits. The visualized skeletal structures are unremarkable. 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 Electronically Signed   By: Aram Candela M.D.   On: 04/26/2021 16:19    Procedures .Critical Care Performed by: Eber Hong, MD Authorized by: Eber Hong, MD   Critical care provider statement:    Critical care time (minutes):  35   Critical care time was exclusive of:  Separately billable procedures and treating other patients and teaching time   Critical care was necessary to treat or prevent imminent or life-threatening deterioration of the following conditions: severe anemia, severe hyponatremia.   Critical care was time spent personally by me on the following activities:  Blood draw for specimens, development of treatment plan with patient or surrogate, discussions with consultants, evaluation of patient's response to treatment, examination of patient, obtaining history from patient or surrogate, ordering and performing treatments and interventions, ordering and review of laboratory studies, ordering and review of radiographic studies, pulse oximetry, re-evaluation of patient's condition and review of old charts     Medications Ordered in ED Medications  pantoprazole (PROTONIX) 80 mg in sodium chloride 0.9 % 100 mL (0.8 mg/mL) infusion (8 mg/hr Intravenous New Bag/Given 04/26/21 1648)  pantoprazole (PROTONIX) injection  40 mg (has no administration in time range)  0.9 %  sodium chloride infusion (Manually program via Guardrails IV Fluids) (has no administration in time range)  pantoprazole (PROTONIX) 80 mg in sodium chloride 0.9 % 100 mL IVPB (80 mg Intravenous New  Bag/Given 04/26/21 1620)    ED Course  I have reviewed the triage vital signs and the nursing notes.  Pertinent labs & imaging results that were available during my care of the patient were reviewed by me and considered in my medical decision making (see chart for details).    MDM Rules/Calculators/A&P                          I have reviewed the patient's labs from the prehospital arena, unfortunately she has a severe anemia at 6.8 and has a sodium of 115, both of these labs are critical in nature and will need to be addressed.  She will need a rectal exam with Hemoccult, she will need to have her sodium rechecked and this will need to be fixed, she will need to be admitted to high level of care, Protonix with a drip has been ordered, type and screen and she will likely need to be transfused.  Thankfully she has not hypotensive in an unstable way, her blood pressure is currently 104/57  Chaperone present for rectal exam - dark liquid stool in vault =- hemocculr positive.  Normal appaering exam otherwise without hemorrhoids, fissures or masses  Severe anemia Hemoccult positive - assumption this is GI related - protonix drip started Severe hyponatremia - no seizures - fluids and admit  Discussed with Dr. Adrian Blackwater who has been kind enough to see this patient in consultation and admit  Final Clinical Impression(s) / ED Diagnoses Final diagnoses:  Symptomatic anemia  Acute hyponatremia      Eber Hong, MD 04/26/21 [id number]

## 2021-04-26 NOTE — ED Triage Notes (Signed)
Pt states she was seen at her PCP and told to come to ED due to low Hgb; pt states she has not had much of an appetite this week

## 2021-04-26 NOTE — ED Provider Notes (Signed)
Emergency Medicine Provider Triage Evaluation Note  Deborah Walton , a 55 y.o. female  was evaluated in triage.  Pt complains of abnormal lab result of hemoglobin at 7.  Patient has had generalized fatigue, nausea, vomiting, diarrhea, and chills since Monday.  Patient was seen by her PCP yesterday.  Was contacted today with abnormal lab result.  Patient reports feeling near syncope at this time.  Review of Systems  Positive: Fatigue, nausea, vomiting, diarrhea, chills, lightheadedness Negative: Fever, syncope, hematuria, blood in stool, melena, hematemesis  Physical Exam  BP (!) 104/57 (BP Location: Right Arm)   Pulse 64   Temp 97.6 F (36.4 C) (Oral)   Resp 18   SpO2 100%  Gen:   Awake, no distress   Resp:  Normal effort, clear to auscultation bilaterally MSK:   Moves extremities without difficulty  Other:    Medical Decision Making  Medically screening exam initiated at 3:11 PM.  Appropriate orders placed.  Deborah Walton was informed that the remainder of the evaluation will be completed by another provider, this initial triage assessment does not replace that evaluation, and the importance of remaining in the ED until their evaluation is complete.  Will have patient move back to next available room.  The patient appears stable so that the remainder of the work up may be completed by another provider.      Haskel Schroeder, PA-C 04/26/21 1513    Eber Hong, MD 04/26/21 [phone]

## 2021-04-26 NOTE — H&P (Addendum)
History and Physical  DIONICIA CERRITOS IWP:[id number] DOB: 08-19-1966 DOA: 04/26/2021  Referring physician: Dr Hyacinth Meeker, ED physician PCP: [facility]  Outpatient Specialists:  Patient Coming From: home  Chief Complaint: Weakness, melena  HPI: Deborah Walton is a 55 y.o. female with a history of depression, tobacco abuse, heavy alcohol use.  Patient presents with several months of epigastric pain with recent melena over the past several weeks.  She did have Pepto-Bismol to help with epigastric pain.  Patient has been using ibuprofen 400 mg every 6 hours for the past several months.  She is also a every day alcohol drinker: About 6 beers a night.  Over the past 2 weeks she has been feeling increasingly weak and fatigued.  She has had little to eat or drink.  She has not had any alcohol in the past 4 days.  She saw her PCP earlier today.  Blood work was obtained.  She was sent here for the results.  Emergency Department Course: Typed and crossmatched.  Started on Protonix drip  Review of Systems:   Pt denies any fevers, chills, nausea, vomiting, diarrhea, constipation, shortness of breath, dyspnea on exertion, orthopnea, cough, wheezing, palpitations, headache, vision changes, rectal bleeding.  Review of systems are otherwise negative  Past Medical History:  Diagnosis Date  . Depression   . Environmental allergies   . Hernia of abdominal wall   . Smoker 07/07/11   Past Surgical History:  Procedure Laterality Date  . BACK SURGERY    . TONSILLECTOMY     Social History:  reports that she has been smoking. She has been smoking about 1.50 packs per day. She has never used smokeless tobacco. She reports current alcohol use. She reports that she does not use drugs. Patient lives at home  Allergies  Allergen Reactions  . Codeine   . Other     "ALL PAIN MEDICATIONS"    History reviewed. No pertinent family history.    Prior to Admission medications   Medication  Sig Start Date End Date Taking? Authorizing Provider  albuterol (VENTOLIN HFA) 108 (90 Base) MCG/ACT inhaler Inhale 2 puffs into the lungs every 4 (four) hours as needed. 01/14/21  Yes [provider]  calcium carbonate (TUMS - DOSED IN MG ELEMENTAL CALCIUM) 500 MG chewable tablet Chew 2-4 tablets by mouth every 4 (four) hours as needed for heartburn.   Yes [provider]  citalopram (CELEXA) 20 MG tablet Take 20 mg by mouth daily.   Yes [provider]  Cyanocobalamin (VITAMIN B-12 CR PO) Take 1 tablet by mouth daily.   Yes [provider]  diazepam (VALIUM) 5 MG tablet Take 2.5 mg by mouth every 6 (six) hours as needed for anxiety or sleep.   Yes [provider]  diltiazem (CARDIZEM CD) 240 MG 24 hr capsule Take 240 mg by mouth daily. 03/21/21  Yes [provider]  ibuprofen (ADVIL) 100 MG tablet Take 400 mg by mouth every 6 (six) hours as needed for fever.   Yes [provider]  loratadine (CLARITIN) 10 MG tablet Take 10 mg by mouth daily.   Yes [provider]  ondansetron (ZOFRAN) 4 MG tablet Take 4 mg by mouth every 6 (six) hours as needed. 04/25/21  Yes [provider]  tetrahydrozoline-zinc (VISINE-AC) 0.05-0.25 % ophthalmic solution Place 2 drops into both eyes 3 (three) times daily as needed.   Yes [provider]  acidophilus (RISAQUAD) CAPS Take 1 capsule by mouth  daily. Patient not taking: Reported on 04/26/2021    [provider]  levocetirizine (XYZAL) 5 MG tablet Take 5 mg by mouth every other day. Patient not taking: Reported on 04/26/2021    [provider]  omeprazole (PRILOSEC) 20 MG capsule Take 1 po BID x 2 weeks then once a day Patient not taking: Reported on 04/26/2021 07/06/13   Devoria AlbeKnapp, Iva, MD  Pediatric Multiple Vit-C-FA (FLINSTONES GUMMIES OMEGA-3 DHA PO) Take 1 tablet by mouth daily. Patient not taking: Reported on 04/26/2021    [provider]  venlafaxine (EFFEXOR)  75 MG tablet Take 75 mg by mouth 2 (two) times daily. Patient not taking: Reported on 04/26/2021    [provider]    Physical Exam: BP 98/70   Pulse 62   Temp 97.6 F (36.4 C) (Oral)   Resp (!) 21   SpO2 100%   . General: Middle-age female. Awake and alert and oriented x3. No acute cardiopulmonary distress.  Marland Kitchen. HEENT: Normocephalic atraumatic.  Right and left ears normal in appearance.  Pupils equal, round, reactive to light. Extraocular muscles are intact. Sclerae anicteric and noninjected.  Pale and dry mucosal membranes. No mucosal lesions.  . Neck: Neck supple without lymphadenopathy. No carotid bruits. No masses palpated.  . Cardiovascular: Regular rate with normal S1-S2 sounds. No murmurs, rubs, gallops auscultated. No JVD.  Marland Kitchen. Respiratory: Good respiratory effort with no wheezes, rales, rhonchi. Lungs clear to auscultation bilaterally.  No accessory muscle use. . Abdomen: Soft, epigastric pain without rebound or guarding, nondistended. Active bowel sounds. No masses or hepatosplenomegaly  . Skin: No rashes, lesions, or ulcerations.  Dry, warm to touch. 2+ dorsalis pedis and radial pulses. . Musculoskeletal: No calf or leg pain. All major joints not erythematous nontender.  No upper or lower joint deformation.  Good ROM.  No contractures  . Psychiatric: Intact judgment and insight. Pleasant and cooperative. . Neurologic: No focal neurological deficits. Strength is 5/5 and symmetric in upper and lower extremities.  Cranial nerves II through XII are grossly intact.           Labs on Admission: I have personally reviewed following labs and imaging studies  CBC: Recent Labs  Lab 04/26/21 1533  WBC 5.2  NEUTROABS 3.6  HGB 6.8*  HCT 21.7*  MCV 68.0*  PLT 330   Basic Metabolic Panel: Recent Labs  Lab 04/26/21 1533  NA 111*  K 3.1*  CL 77*  CO2 24  GLUCOSE 104*  BUN 5*  CREATININE 0.56  CALCIUM 8.6*   GFR: CrCl cannot be calculated (Unknown ideal  weight.). Liver Function Tests: Recent Labs  Lab 04/26/21 1533  AST 43*  ALT 30  ALKPHOS 59  BILITOT 0.5  PROT 6.7  ALBUMIN 4.3   Recent Labs  Lab 04/26/21 1533  LIPASE 28   No results for input(s): AMMONIA in the last 168 hours. Coagulation Profile: Recent Labs  Lab 04/26/21 1533  INR 1.0   Cardiac Enzymes: No results for input(s): CKTOTAL, CKMB, CKMBINDEX, TROPONINI in the last 168 hours. BNP (last 3 results) No results for input(s): PROBNP in the last 8760 hours. HbA1C: No results for input(s): HGBA1C in the last 72 hours. CBG: No results for input(s): GLUCAP in the last 168 hours. Lipid Profile: No results for input(s): CHOL, HDL, LDLCALC, TRIG, CHOLHDL, LDLDIRECT in the last 72 hours. Thyroid Function Tests: No results for input(s): TSH, T4TOTAL, FREET4, T3FREE, THYROIDAB in the last 72 hours. Anemia Panel: No results for input(s): VITAMINB12,  FOLATE, FERRITIN, TIBC, IRON, RETICCTPCT in the last 72 hours. Urine analysis:    Component Value Date/Time   COLORURINE STRAW (A) 04/26/2021 1514   APPEARANCEUR CLEAR 04/26/2021 1514   LABSPEC 1.004 (L) 04/26/2021 1514   PHURINE 6.0 04/26/2021 1514   GLUCOSEU NEGATIVE 04/26/2021 1514   HGBUR NEGATIVE 04/26/2021 1514   BILIRUBINUR NEGATIVE 04/26/2021 1514   KETONESUR 5 (A) 04/26/2021 1514   PROTEINUR NEGATIVE 04/26/2021 1514   UROBILINOGEN 0.2 07/06/2013 1150   NITRITE NEGATIVE 04/26/2021 1514   LEUKOCYTESUR TRACE (A) 04/26/2021 1514   Sepsis Labs: @LABRCNTIP (procalcitonin:4,lacticidven:4) ) Recent Results (from the past 240 hour(s))  Resp Panel by RT-PCR (Flu A&B, Covid) Nasopharyngeal Swab     Status: None   Collection Time: 04/26/21  3:42 PM   Specimen: Nasopharyngeal Swab; Nasopharyngeal(NP) swabs in vial transport medium  Result Value Ref Range Status   SARS Coronavirus 2 by RT PCR NEGATIVE NEGATIVE Final    Comment: (NOTE) SARS-CoV-2 target nucleic acids are NOT DETECTED.  The SARS-CoV-2 RNA is  generally detectable in upper respiratory specimens during the acute phase of infection. The lowest concentration of SARS-CoV-2 viral copies this assay can detect is 138 copies/mL. A negative result does not preclude SARS-Cov-2 infection and should not be used as the sole basis for treatment or other patient management decisions. A negative result may occur with  improper specimen collection/handling, submission of specimen other than nasopharyngeal swab, presence of viral mutation(s) within the areas targeted by this assay, and inadequate number of viral copies(<138 copies/mL). A negative result must be combined with clinical observations, patient history, and epidemiological information. The expected result is Negative.  Fact Sheet for Patients:  06/26/21  Fact Sheet for Healthcare Providers:  BloggerCourse.com  This test is no t yet approved or cleared by the [employer] FDA and  has been authorized for detection and/or diagnosis of SARS-CoV-2 by FDA under an Emergency Use Authorization (EUA). This EUA will remain  in effect (meaning this test can be used) for the duration of the COVID-19 declaration under Section 564(b)(1) of the Act, 21 U.S.C.section 360bbb-3(b)(1), unless the authorization is terminated  or revoked sooner.       Influenza A by PCR NEGATIVE NEGATIVE Final   Influenza B by PCR NEGATIVE NEGATIVE Final    Comment: (NOTE) The Xpert Xpress SARS-CoV-2/FLU/RSV plus assay is intended as an aid in the diagnosis of influenza from Nasopharyngeal swab specimens and should not be used as a sole basis for treatment. Nasal washings and aspirates are unacceptable for Xpert Xpress SARS-CoV-2/FLU/RSV testing.  Fact Sheet for Patients: [place]  Fact Sheet for Healthcare Providers: BloggerCourse.com  This test is not yet approved or cleared by the [employer] FDA and has been authorized for detection and/or diagnosis of SARS-CoV-2 by FDA under an Emergency Use Authorization (EUA). This EUA will remain in effect (meaning this test can be used) for the duration of the COVID-19 declaration under Section 564(b)(1) of the Act, 21 U.S.C. section 360bbb-3(b)(1), unless the authorization is terminated or revoked.  Performed at [facility], [address], [place], [place]      Radiological Exams on Admission: DG Chest Portable 1 View  Result Date: 04/26/2021 CLINICAL DATA:  Cough. EXAM: PORTABLE CHEST 1 VIEW COMPARISON:  May 14, 2010 FINDINGS: The lungs are hyperinflated. Mild, biapical atelectasis is noted. There is no evidence of acute infiltrate, pleural effusion or pneumothorax. The heart size and mediastinal contours are within normal limits. The visualized skeletal structures are unremarkable.  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 Electronically Signed   By: Aram Candela M.D.   On: 04/26/2021 16:19   Assessment/Plan: Principal Problem:   Acute hyponatremia Active Problems:   Alcohol use   Iron deficiency anemia secondary to blood loss (chronic)   Tobacco abuse   COPD with chronic bronchitis (HCC)   Hypokalemia    This patient was discussed with the ED physician, including pertinent vitals, physical exam findings, labs, and imaging.  We also discussed care given by the ED provider.  1. Acute hyponatremia a. Secondary to beer potomania and malnutrition. b. Will be receiving blood transfusion which contains quite a bit of sodium.  We will hold off on any acute sodium replacement. c. Likely malnutrition from chronic beer use -patient at high risk for demyelinating syndrome from rapid correction of sodium d. BMP every 2 hours e. NPO after midnight 2. Alcoholic use a. Last use per patient 4 nights ago b. CIWA protocol 3. Iron deficiency anemia secondary to chronic blood loss a. Transfuse 2  units b. Consult GI c. CBC tomorrow 4. Tobacco abuse a. Patient request Nicorette gum 5. COPD 6. Hypokalemia a. Replace potassium b. Check potassium  DVT prophylaxis: SCDs Consultants: Gastroenterology Code Status: Full code Family Communication: Husband present Disposition Plan: Patient should be able to return home following improvement of sodium and correction of anemia   Levie Heritage, DO

## 2021-04-27 ENCOUNTER — Inpatient Hospital Stay (HOSPITAL_COMMUNITY): Payer: 59 | Admitting: Anesthesiology

## 2021-04-27 ENCOUNTER — Encounter (HOSPITAL_COMMUNITY)
Admission: EM | Discharge status: Against Medical Advice | Payer: Self-pay | Source: Home / Self Care | Attending: Family Medicine

## 2021-04-27 DIAGNOSIS — K921 Melena: Principal | ICD-10-CM

## 2021-04-27 DIAGNOSIS — D509 Iron deficiency anemia, unspecified: Secondary | ICD-10-CM

## 2021-04-27 DIAGNOSIS — E871 Hypo-osmolality and hyponatremia: Secondary | ICD-10-CM

## 2021-04-27 DIAGNOSIS — K922 Gastrointestinal hemorrhage, unspecified: Secondary | ICD-10-CM

## 2021-04-27 DIAGNOSIS — K3189 Other diseases of stomach and duodenum: Secondary | ICD-10-CM

## 2021-04-27 DIAGNOSIS — Z7289 Other problems related to lifestyle: Secondary | ICD-10-CM

## 2021-04-27 HISTORY — PX: BIOPSY: SHX5522

## 2021-04-27 HISTORY — PX: ESOPHAGOGASTRODUODENOSCOPY (EGD) WITH PROPOFOL: SHX5813

## 2021-04-27 LAB — BASIC METABOLIC PANEL
Anion gap: 8 (ref 5–15)
Anion gap: 9 (ref 5–15)
Anion gap: 9 (ref 5–15)
Anion gap: 9 (ref 5–15)
BUN: 5 mg/dL — ABNORMAL LOW (ref 6–20)
BUN: <5 mg/dL — ABNORMAL LOW (ref 6–20)
BUN: <5 mg/dL — ABNORMAL LOW (ref 6–20)
BUN: <5 mg/dL — ABNORMAL LOW (ref 6–20)
CO2: 23 mmol/L (ref 22–32)
CO2: 24 mmol/L (ref 22–32)
CO2: 24 mmol/L (ref 22–32)
CO2: 24 mmol/L (ref 22–32)
Calcium: 8.8 mg/dL — ABNORMAL LOW (ref 8.9–10.3)
Calcium: 9 mg/dL (ref 8.9–10.3)
Calcium: 9.1 mg/dL (ref 8.9–10.3)
Calcium: 9.2 mg/dL (ref 8.9–10.3)
Chloride: 88 mmol/L — ABNORMAL LOW (ref 98–111)
Chloride: 91 mmol/L — ABNORMAL LOW (ref 98–111)
Chloride: 95 mmol/L — ABNORMAL LOW (ref 98–111)
Chloride: 96 mmol/L — ABNORMAL LOW (ref 98–111)
Creatinine, Ser: 0.54 mg/dL (ref 0.44–1.00)
Creatinine, Ser: 0.61 mg/dL (ref 0.44–1.00)
Creatinine, Ser: 0.62 mg/dL (ref 0.44–1.00)
Creatinine, Ser: 0.62 mg/dL (ref 0.44–1.00)
GFR, Estimated: >60 mL/min (ref >60)
GFR, Estimated: >60 mL/min (ref >60)
GFR, Estimated: >60 mL/min (ref >60)
GFR, Estimated: >60 mL/min (ref >60)
Glucose, Bld: 87 mg/dL (ref 70–99)
Glucose, Bld: 87 mg/dL (ref 70–99)
Glucose, Bld: 88 mg/dL (ref 70–99)
Glucose, Bld: 91 mg/dL (ref 70–99)
Potassium: 3.2 mmol/L — ABNORMAL LOW (ref 3.5–5.1)
Potassium: 3.7 mmol/L (ref 3.5–5.1)
Potassium: 3.7 mmol/L (ref 3.5–5.1)
Potassium: 3.8 mmol/L (ref 3.5–5.1)
Sodium: 121 mmol/L — ABNORMAL LOW (ref 135–145)
Sodium: 124 mmol/L — ABNORMAL LOW (ref 135–145)
Sodium: 127 mmol/L — ABNORMAL LOW (ref 135–145)
Sodium: 128 mmol/L — ABNORMAL LOW (ref 135–145)

## 2021-04-27 LAB — TYPE AND SCREEN
ABO/RH(D): B POS
Antibody Screen: NEGATIVE
Unit division: 0
Unit division: 0

## 2021-04-27 LAB — BPAM RBC
Blood Product Expiration Date: 202205262359
Blood Product Expiration Date: 202206022359
ISSUE DATE / TIME: 202205061921
ISSUE DATE / TIME: 202205062211
Unit Type and Rh: 1700
Unit Type and Rh: 1700

## 2021-04-27 LAB — CBC
HCT: 32.6 % — ABNORMAL LOW (ref 36.0–46.0)
Hemoglobin: 10.3 g/dL — ABNORMAL LOW (ref 12.0–15.0)
MCH: 23.6 pg — ABNORMAL LOW (ref 26.0–34.0)
MCHC: 31.6 g/dL (ref 30.0–36.0)
MCV: 74.8 fL — ABNORMAL LOW (ref 80.0–100.0)
Platelets: 266 10*3/uL (ref 150–400)
RBC: 4.36 MIL/uL (ref 3.87–5.11)
RDW: 18.1 % — ABNORMAL HIGH (ref 11.5–15.5)
WBC: 6.2 10*3/uL (ref 4.0–10.5)
nRBC: 0 % (ref 0.0–0.2)

## 2021-04-27 LAB — HIV ANTIBODY (ROUTINE TESTING W REFLEX): HIV Screen 4th Generation wRfx: NONREACTIVE

## 2021-04-27 LAB — MRSA PCR SCREENING: MRSA by PCR: NEGATIVE

## 2021-04-27 SURGERY — ESOPHAGOGASTRODUODENOSCOPY (EGD) WITH PROPOFOL
Anesthesia: General

## 2021-04-27 MED ORDER — LACTATED RINGERS IV SOLN: INTRAVENOUS | Status: DC | PRN

## 2021-04-27 MED ORDER — PROPOFOL 10 MG/ML IV BOLUS
INTRAVENOUS | Status: DC | PRN
  Administered 2021-04-27 (×3): 20 mg via INTRAVENOUS
  Administered 2021-04-27: 50 mg via INTRAVENOUS
  Administered 2021-04-27: 100 mg via INTRAVENOUS
  Administered 2021-04-27 (×2): 20 mg via INTRAVENOUS

## 2021-04-27 MED ORDER — SODIUM CHLORIDE 0.9 % IV SOLN: INTRAVENOUS | Status: DC

## 2021-04-27 MED ORDER — LACTATED RINGERS IV SOLN: INTRAVENOUS | Status: DC

## 2021-04-27 MED ORDER — STERILE WATER FOR IRRIGATION IR SOLN
Status: DC | PRN
  Administered 2021-04-27: 200 mL

## 2021-04-27 MED ORDER — NICOTINE 7 MG/24HR TD PT24
7.0000 mg | MEDICATED_PATCH | Freq: Every day | TRANSDERMAL | Status: DC
  Administered 2021-04-27: 7 mg via TRANSDERMAL
  Filled 2021-04-27: qty 1

## 2021-04-27 MED ORDER — SODIUM CHLORIDE 0.9% IV SOLUTION: Freq: Once | INTRAVENOUS | Status: DC

## 2021-04-27 MED ORDER — CHLORHEXIDINE GLUCONATE CLOTH 2 % EX PADS
6.0000 | MEDICATED_PAD | Freq: Every day | CUTANEOUS | Status: DC
  Administered 2021-04-27: 6 via TOPICAL

## 2021-04-27 MED ORDER — LIDOCAINE HCL (CARDIAC) PF 100 MG/5ML IV SOSY
PREFILLED_SYRINGE | INTRAVENOUS | Status: DC | PRN
  Administered 2021-04-27: 100 mg via INTRATRACHEAL

## 2021-04-27 MED ORDER — PANTOPRAZOLE SODIUM 40 MG PO TBEC
40.0000 mg | DELAYED_RELEASE_TABLET | Freq: Two times a day (BID) | ORAL | Status: DC
  Administered 2021-04-27: 40 mg via ORAL
  Filled 2021-04-27 (×2): qty 1

## 2021-04-27 NOTE — Op Note (Addendum)
[facility] Patient Name: Deborah Walton Procedure Date: 04/27/2021 1:04 PM MRN: [id number] Date of Birth: 1966/09/24 Attending MD: Katrinka Blazing ,  CSN: [id number] Age: 55 Admit Type: Inpatient Procedure:                Upper GI endoscopy Indications:              Iron deficiency anemia, Melena Providers:                Katrinka Blazing, Nena Polio, RN, Cyril Mourning, Technician Referring MD:              Medicines:                Monitored Anesthesia Care Complications:            No immediate complications. Estimated Blood Loss:     Estimated blood loss: none. Procedure:                Pre-Anesthesia Assessment:                           - Prior to the procedure, a History and Physical                            was performed, and patient medications, allergies                            and sensitivities were reviewed. The patient's                            tolerance of previous anesthesia was reviewed.                           - The risks and benefits of the procedure and the                            sedation options and risks were discussed with the                            patient. All questions were answered and informed                            consent was obtained.                           - ASA Grade Assessment: II - A patient with mild                            systemic disease.                           After obtaining informed consent, the endoscope was                            passed under direct vision. Throughout the  procedure, the patient's blood pressure, pulse, and                            oxygen saturations were monitored continuously. The                            [id number]-[id number] ([id number]) scope was introduced through the                            mouth, and advanced to the second part of duodenum.                            The upper GI endoscopy was accomplished without                             difficulty. The patient tolerated the procedure                            well. Scope In: 1:30:38 PM Scope Out: 1:41:55 PM Total Procedure Duration: 0 hours 11 minutes 17 seconds  Findings:      The examined esophagus was normal.      The entire examined stomach was normal. Biopsies were taken with a cold       forceps for Helicobacter pylori testing. Upon careful inspection, no       presence of hematin or active bleeding was present.      A 12 mm healed ulcer was found in the first portion of the duodenum. The       scar tissue was healthy in appearance. Biopsies from the scar adn SB       were taken with a cold forceps for histology. Upon careful inspection,       no presence of hematin or active bleeding was present in the rest of the       duodenum. Impression:               - Normal esophagus.                           - Normal stomach. Biopsied.                           - Duodenal scar. Biopsied. Moderate Sedation:      Per Anesthesia Care Recommendation:           - Return patient to hospital ward for ongoing care.                           - Full liquid diet.                           - Check H/H every day.                           - Continue pantoprazole 40 mg daily.                           - Await pathology results.                           -  No ibuprofen, naproxen, or other non-steroidal                            anti-inflammatory drugs.                           - Will consider inpatient vs outpatient colonoscopy                            based on hemoglobin trend. Procedure Code(s):        --- Professional ---                           [phone], Esophagogastroduodenoscopy, flexible,                            transoral; with biopsy, single or multiple Diagnosis Code(s):        --- Professional ---                           K31.89, Other diseases of stomach and duodenum                           D50.9, Iron deficiency anemia, unspecified                            K92.1, Melena (includes Hematochezia) CPT copyright 2019 American Medical Association. All rights reserved. The codes documented in this report are preliminary and upon coder review may  be revised to meet current compliance requirements. Katrinka Blazing, MD Katrinka Blazing,  04/27/2021 1:47:27 PM This report has been signed electronically. Number of Addenda: 0

## 2021-04-27 NOTE — Discharge Summary (Signed)
Physician Discharge Summary  Deborah Walton:[id number] DOB: 01-12-1966 DOA: 04/26/2021  PCP: [facility]  Admit date: 04/26/2021  Discharge date: 04/27/2021 [facility] DISCHARGE  Admitted From:Home  Disposition:  AMA DISCHARGE  Discharge Condition:Stable  CODE STATUS: Full  Brief/Interim Summary:  Deborah Walton a 54 y.o.femalewith a history of depression, tobacco abuse, heavy alcohol use. Patient presented with several months of epigastric pain with recent melena over the past several weeks.  Patient was admitted with chronic blood loss anemia in the setting of suspected upper GI bleed with NSAID use as well as alcohol use.  She is also noted to have significant hyponatremia related to alcohol use. She received a 2 U PRBC transfusion with improvements in hemoglobin levels and no further overt bleeding noted. Additionally, her sodium levels were noted to be improving, but she continued to require further monitoring before discharge. She did undergo EGD on 5/7 with no significant findings and stomach biopsies were taken to check for H Pylori. Unfortunately, she would like to leave AMA and has full decision making capacity. She has signed paperwork.  I have evaluated the medical decision-making capacity of this patient at bedside and have determined that he/she is definitely/probably in/capable based on the following criteria:  1) Is able to understand the active medical problem 2) Is able to understand the proposed treatment plan 3) Is able to understand alternatives to the proposed treatment plan 4) Is able to understand the option of refusing the proposed treatment plan 5) Is able to appreciate the foreseeable consequences of accepting and refusing the proposed treatment plan  Additionally, the patient's decision does not appear to influenced by any sign of depression or delusion/psychosis.   Time taken to administer capacity evaluation: 10 mins  Surrogate  decision maker: no   Discharge Diagnoses:  Principal Problem:   Acute hyponatremia Active Problems:   Alcohol use   Iron deficiency anemia secondary to blood loss (chronic)   Tobacco abuse   COPD with chronic bronchitis (HCC)   Hypokalemia   Principle discharge diagnosis: Chronic blood loss anemia; acute hyponatremia related to alcohol use.  Allergies  Allergen Reactions  . Codeine   . Other     "ALL PAIN MEDICATIONS"    Consultations:  GI   Procedures/Studies: DG Chest Portable 1 View  Result Date: 04/26/2021 CLINICAL DATA:  Cough. EXAM: PORTABLE CHEST 1 VIEW COMPARISON:  May 14, 2010 FINDINGS: The lungs are hyperinflated. Mild, biapical atelectasis is noted. There is no evidence of acute infiltrate, pleural effusion or pneumothorax. The heart size and mediastinal contours are within normal limits. The visualized skeletal structures are unremarkable. 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 Electronically Signed   By: Aram Candela M.D.   On: 04/26/2021 16:19      Discharge Exam: Vitals:   04/27/21 1400 04/27/21 1415  BP:  137/90  Pulse:  73  Resp:  15  Temp:    SpO2: 99% 100%   Vitals:   04/27/21 1327 04/27/21 1358 04/27/21 1400 04/27/21 1415  BP: (!) 167/83 (!) 142/90  137/90  Pulse: 71 76  73  Resp: 13 14  15   Temp: 98.6 F (37 C) 98.2 F (36.8 C)    TempSrc:      SpO2: 100% 100% 99% 100%  Weight:      Height:        General: Pt is alert, awake, not in acute distress Cardiovascular: RRR, S1/S2 +, no rubs, no gallops Respiratory: CTA bilaterally, no  wheezing, no rhonchi Abdominal: Soft, NT, ND, bowel sounds + Extremities: no edema, no cyanosis    The results of significant diagnostics from this hospitalization (including imaging, microbiology, ancillary and laboratory) are listed below for reference.     Microbiology: Recent Results (from the past 240 hour(s))  Resp Panel by RT-PCR (Flu A&B, Covid) Nasopharyngeal  Swab     Status: None   Collection Time: 04/26/21  3:42 PM   Specimen: Nasopharyngeal Swab; Nasopharyngeal(NP) swabs in vial transport medium  Result Value Ref Range Status   SARS Coronavirus 2 by RT PCR NEGATIVE NEGATIVE Final    Comment: (NOTE) SARS-CoV-2 target nucleic acids are NOT DETECTED.  The SARS-CoV-2 RNA is generally detectable in upper respiratory specimens during the acute phase of infection. The lowest concentration of SARS-CoV-2 viral copies this assay can detect is 138 copies/mL. A negative result does not preclude SARS-Cov-2 infection and should not be used as the sole basis for treatment or other patient management decisions. A negative result may occur with  improper specimen collection/handling, submission of specimen other than nasopharyngeal swab, presence of viral mutation(s) within the areas targeted by this assay, and inadequate number of viral copies(<138 copies/mL). A negative result must be combined with clinical observations, patient history, and epidemiological information. The expected result is Negative.  Fact Sheet for Patients:  BloggerCourse.com  Fact Sheet for Healthcare Providers:  SeriousBroker.it  This test is no t yet approved or cleared by the [place] FDA and  has been authorized for detection and/or diagnosis of SARS-CoV-2 by FDA under an Emergency Use Authorization (EUA). This EUA will remain  in effect (meaning this test can be used) for the duration of the COVID-19 declaration under Section 564(b)(1) of the Act, 21 U.S.C.section 360bbb-3(b)(1), unless the authorization is terminated  or revoked sooner.       Influenza A by PCR NEGATIVE NEGATIVE Final   Influenza B by PCR NEGATIVE NEGATIVE Final    Comment: (NOTE) The Xpert Xpress SARS-CoV-2/FLU/RSV plus assay is intended as an aid in the diagnosis of influenza from Nasopharyngeal swab specimens and should not be used as a sole  basis for treatment. Nasal washings and aspirates are unacceptable for Xpert Xpress SARS-CoV-2/FLU/RSV testing.  Fact Sheet for Patients: BloggerCourse.com  Fact Sheet for Healthcare Providers: SeriousBroker.it  This test is not yet approved or cleared by the [place] FDA and has been authorized for detection and/or diagnosis of SARS-CoV-2 by FDA under an Emergency Use Authorization (EUA). This EUA will remain in effect (meaning this test can be used) for the duration of the COVID-19 declaration under Section 564(b)(1) of the Act, 21 U.S.C. section 360bbb-3(b)(1), unless the authorization is terminated or revoked.  Performed at [facility], [address] [place], [place] [address]   MRSA PCR Screening     Status: None   Collection Time: 04/26/21  9:12 PM   Specimen: Nasal Mucosa; Nasopharyngeal  Result Value Ref Range Status   MRSA by PCR NEGATIVE NEGATIVE Final    Comment:        The GeneXpert MRSA Assay (FDA approved for NASAL specimens only), is one component of a comprehensive MRSA colonization surveillance program. It is not intended to diagnose MRSA infection nor to guide or monitor treatment for MRSA infections. Performed at [facility], [address] [place], [place] [address]      Labs: BNP (last 3 results) No results for input(s): BNP in the last 8760 hours. Basic Metabolic Panel: Recent Labs  Lab 04/26/21 1748  04/26/21 1749 04/26/21 2155 04/26/21 2354 04/27/21 0146 04/27/21 0420 04/27/21 0812  NA  --    < > 118* 121* 124* 127* 128*  K  --    < > 2.9* 3.2* 3.7 3.7 3.8  CL  --    < > 85* 88* 91* 95* 96*  CO2  --    < > 24 24 24 24 23   GLUCOSE  --    < > 113* 87 87 91 88  BUN  --    < > 5* 5* <5* <5* <5*  CREATININE  --    < > 0.63 0.61 0.62 0.54 0.62  CALCIUM  --    < > 8.7* 8.8* 9.0 9.2 9.1  MG 2.0  --   --   --   --   --   --   PHOS 3.2  --   --   --   --   --   --    < > = values in  this interval not displayed.   Liver Function Tests: Recent Labs  Lab 04/26/21 1533  AST 43*  ALT 30  ALKPHOS 59  BILITOT 0.5  PROT 6.7  ALBUMIN 4.3   Recent Labs  Lab 04/26/21 1533  LIPASE 28   No results for input(s): AMMONIA in the last 168 hours. CBC: Recent Labs  Lab 04/26/21 1533 04/27/21 0601  WBC 5.2 6.2  NEUTROABS 3.6  --   HGB 6.8* 10.3*  HCT 21.7* 32.6*  MCV 68.0* 74.8*  PLT 330 266   Cardiac Enzymes: No results for input(s): CKTOTAL, CKMB, CKMBINDEX, TROPONINI in the last 168 hours. BNP: Invalid input(s): POCBNP CBG: No results for input(s): GLUCAP in the last 168 hours. D-Dimer No results for input(s): DDIMER in the last 72 hours. Hgb A1c No results for input(s): HGBA1C in the last 72 hours. Lipid Profile No results for input(s): CHOL, HDL, LDLCALC, TRIG, CHOLHDL, LDLDIRECT in the last 72 hours. Thyroid function studies No results for input(s): TSH, T4TOTAL, T3FREE, THYROIDAB in the last 72 hours.  Invalid input(s): FREET3 Anemia work up No results for input(s): VITAMINB12, FOLATE, FERRITIN, TIBC, IRON, RETICCTPCT in the last 72 hours. Urinalysis    Component Value Date/Time   COLORURINE STRAW (A) 04/26/2021 1514   APPEARANCEUR CLEAR 04/26/2021 1514   LABSPEC 1.004 (L) 04/26/2021 1514   PHURINE 6.0 04/26/2021 1514   GLUCOSEU NEGATIVE 04/26/2021 1514   HGBUR NEGATIVE 04/26/2021 1514   BILIRUBINUR NEGATIVE 04/26/2021 1514   KETONESUR 5 (A) 04/26/2021 1514   PROTEINUR NEGATIVE 04/26/2021 1514   UROBILINOGEN 0.2 07/06/2013 1150   NITRITE NEGATIVE 04/26/2021 1514   LEUKOCYTESUR TRACE (A) 04/26/2021 1514   Sepsis Labs Invalid input(s): PROCALCITONIN,  WBC,  LACTICIDVEN Microbiology Recent Results (from the past 240 hour(s))  Resp Panel by RT-PCR (Flu A&B, Covid) Nasopharyngeal Swab     Status: None   Collection Time: 04/26/21  3:42 PM   Specimen: Nasopharyngeal Swab; Nasopharyngeal(NP) swabs in vial transport medium  Result Value Ref  Range Status   SARS Coronavirus 2 by RT PCR NEGATIVE NEGATIVE Final    Comment: (NOTE) SARS-CoV-2 target nucleic acids are NOT DETECTED.  The SARS-CoV-2 RNA is generally detectable in upper respiratory specimens during the acute phase of infection. The lowest concentration of SARS-CoV-2 viral copies this assay can detect is 138 copies/mL. A negative result does not preclude SARS-Cov-2 infection and should not be used as the sole basis for treatment or other patient management decisions. A negative result  may occur with  improper specimen collection/handling, submission of specimen other than nasopharyngeal swab, presence of viral mutation(s) within the areas targeted by this assay, and inadequate number of viral copies(<138 copies/mL). A negative result must be combined with clinical observations, patient history, and epidemiological information. The expected result is Negative.  Fact Sheet for Patients:  BloggerCourse.comhttps://www.fda.gov/media/152166/download  Fact Sheet for Healthcare Providers:  SeriousBroker.ithttps://www.fda.gov/media/152162/download  This test is no t yet approved or cleared by the [place] FDA and  has been authorized for detection and/or diagnosis of SARS-CoV-2 by FDA under an Emergency Use Authorization (EUA). This EUA will remain  in effect (meaning this test can be used) for the duration of the COVID-19 declaration under Section 564(b)(1) of the Act, 21 U.S.C.section 360bbb-3(b)(1), unless the authorization is terminated  or revoked sooner.       Influenza A by PCR NEGATIVE NEGATIVE Final   Influenza B by PCR NEGATIVE NEGATIVE Final    Comment: (NOTE) The Xpert Xpress SARS-CoV-2/FLU/RSV plus assay is intended as an aid in the diagnosis of influenza from Nasopharyngeal swab specimens and should not be used as a sole basis for treatment. Nasal washings and aspirates are unacceptable for Xpert Xpress SARS-CoV-2/FLU/RSV testing.  Fact Sheet for  Patients: BloggerCourse.comhttps://www.fda.gov/media/152166/download  Fact Sheet for Healthcare Providers: SeriousBroker.ithttps://www.fda.gov/media/152162/download  This test is not yet approved or cleared by the [place] FDA and has been authorized for detection and/or diagnosis of SARS-CoV-2 by FDA under an Emergency Use Authorization (EUA). This EUA will remain in effect (meaning this test can be used) for the duration of the COVID-19 declaration under Section 564(b)(1) of the Act, 21 U.S.C. section 360bbb-3(b)(1), unless the authorization is terminated or revoked.  Performed at [facility], [address] [place], [place] [address]   MRSA PCR Screening     Status: None   Collection Time: 04/26/21  9:12 PM   Specimen: Nasal Mucosa; Nasopharyngeal  Result Value Ref Range Status   MRSA by PCR NEGATIVE NEGATIVE Final    Comment:        The GeneXpert MRSA Assay (FDA approved for NASAL specimens only), is one component of a comprehensive MRSA colonization surveillance program. It is not intended to diagnose MRSA infection nor to guide or monitor treatment for MRSA infections. Performed at [facility], [address] [place], [place] [address]      Time coordinating discharge: 35 minutes  SIGNED:   Erick BlinksPratik D Elexus Barman, DO [facility] 04/27/2021, 3:36 PM  If 7PM-7AM, please contact night-coverage [email or web address]

## 2021-04-27 NOTE — Progress Notes (Signed)
Patient requested to leave since she is not being discharged. Dr. Sherryll Burger notified. Pt educated on risks of leaving and understands this is against medical advice. Patient requested work note for her husband. Dr. Sherryll Burger provided. Note printed and given to patient and husband. AMA form signed and placed in chart. PIV's removed and belongings returned. Patient left with spouse present.

## 2021-04-27 NOTE — Transfer of Care (Signed)
Immediate Anesthesia Transfer of Care Note  Patient: Deborah Walton  Procedure(s) Performed: ESOPHAGOGASTRODUODENOSCOPY (EGD) WITH PROPOFOL (N/A ) BIOPSY  Patient Location: PACU  Anesthesia Type:General  Level of Consciousness: awake, alert  and oriented  Airway & Oxygen Therapy: Patient Spontanous Breathing and Patient connected to nasal cannula oxygen  Post-op Assessment: Report given to RN and Post -op Vital signs reviewed and stable  Post vital signs: Reviewed and stable  Last Vitals:  Vitals Value Taken Time  BP 142/90 04/27/21 1401  Temp 98.2 1401  Pulse 76 1401  Resp 13 04/27/21 1402  SpO2 100 1401  Vitals shown include unvalidated device data.  Last Pain:  Vitals:   04/27/21 1123  TempSrc: Oral  PainSc:       Patients Stated Pain Goal: 0 (04/26/21 2114)  Complications: No complications documented.

## 2021-04-27 NOTE — Brief Op Note (Signed)
04/26/2021 - 04/27/2021  1:47 PM  PATIENT:  Deborah Walton  55 y.o. 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  The patient tolerated the procedure adequately.  Esophagus was normal.  Stomach was within normal limits without presence of any hematin or stigmata of recent bleeding, biopsies from the stomach were obtained to rule out H. pylori.  The duodenum showed presence of 1.2 cm scar in the first portion of the duodenum.  There was no presence of hematin or stigmata of recent bleeding in the examined small bowel.  Random biopsies of the small bowel were obtained.  RECOMMENDATIONS: - Discharge patient to home (ambulatory).  - Full liquid diet. - Check H/H every day. - Continue pantoprazole 40 mg daily.  - Await pathology results.  - No ibuprofen, naproxen, or other non-steroidal anti-inflammatory drugs.  - Will consider inpatient vs outpatient colonoscopy based on hemoglobin trend.  Katrinka Blazing, MD Gastroenterology and Hepatology [facility]

## 2021-04-27 NOTE — Consult Note (Signed)
Deborah Walton, M.D. Gastroenterology & Hepatology                                           Patient Name: Deborah Walton Account #: @FLAACCTNO @   MRN: [id number] Admission Date: 04/26/2021 Date of Evaluation:  04/27/2021 Time of Evaluation: 10:54 AM   Referring Physician: Heath Lark, DO  Chief Complaint:  Melena and anemia  HPI:  This is a 55 y.o. female with past medical history of depression, alcohol abuse, chronic smoking, who comes to the hospital after presenting melena and epigastric pain.  The patient is a poor historian.  She reports that since last Monday she presented new onset of abdominal pain in her upper abdomen along with some episodes of nausea and vomiting, with some watery bowel movements that she describes as melena in multiple episodes.  She states that she was taking Pepto-Bismol for the last week, she cannot remember if she was taking it before or after she presented the melena episodes.  There was no hematochezia.  She states that she has been taking ibuprofen 400 mg every 6 hours for the last 3 months for management of back pain as she was advised to do this by one of her doctors.  She denies having any lightheadedness, dizziness, syncope but endorses having some fatigue.  She denies taking any antiplatelets or anticoagulants.  States that she had an EGD when she were in her 28s, states that the endoscopy was completely normal.  She denies having any colonoscopies in the past.  In the ED, she was HD stable and afebrile. Labs were remarkable for CMP on admission was remarkable for a sodium of 111, potassium 3.1, chloride was 77, lipase was normal 28, renal function was within normal limits, LFTs were only remarkable for AST of 43 with ALT of 30, total bilirubin was normal at 0.5 and alkaline phosphatase was 59.  CBC was remarkable for a hemoglobin of 6.8 with normal white cell count of 5.2 and platelets of 330, INR was 1.0.  The patient was transfused 2 units of PRBC.  Her  repeat sodium today in the morning was 127, hemoglobin was 10.3.  Chest x-ray showed changes consistent with COPD but no other alterations.  FHx: neg for any gastrointestinal/liver disease, on had breast cancer Social: Patient smokes half a pack of cigarettes every day, drinks a sixpack daily for multiple years, denies any illicit drug use  Past Medical History: SEE CHRONIC ISSSUES: Past Medical History:  Diagnosis Date  . Depression   . Environmental allergies   . Hernia of abdominal wall   . Smoker 07/07/11   Past Surgical History:  Past Surgical History:  Procedure Laterality Date  . BACK SURGERY    . TONSILLECTOMY     Family History: History reviewed. No pertinent family history. Social History:  Social History   Tobacco Use  . Smoking status: Current Every Day Smoker    Packs/day: 1.50  . Smokeless tobacco: Never Used  Vaping Use  . Vaping Use: Former  Substance Use Topics  . Alcohol use: Yes    Comment: Daily. 6 beers daily "to gain weight"   . Drug use: No    Home Medications:  Prior to Admission medications   Medication Sig Start Date End Date Taking? Authorizing Provider  albuterol (VENTOLIN HFA) 108 (90 Base) MCG/ACT inhaler Inhale 2 puffs into the lungs  every 4 (four) hours as needed. 01/14/21  Yes [provider]  calcium carbonate (TUMS - DOSED IN MG ELEMENTAL CALCIUM) 500 MG chewable tablet Chew 2-4 tablets by mouth every 4 (four) hours as needed for heartburn.   Yes [provider]  citalopram (CELEXA) 20 MG tablet Take 20 mg by mouth daily.   Yes [provider]  Cyanocobalamin (VITAMIN B-12 CR PO) Take 1 tablet by mouth daily.   Yes [provider]  diazepam (VALIUM) 5 MG tablet Take 2.5 mg by mouth every 6 (six) hours as needed for anxiety or sleep.   Yes [provider]  diltiazem (CARDIZEM CD) 240 MG 24 hr capsule Take 240 mg by mouth daily. 03/21/21  Yes [provider]  ibuprofen (ADVIL) 100 MG tablet  Take 400 mg by mouth every 6 (six) hours as needed for fever.   Yes [provider]  loratadine (CLARITIN) 10 MG tablet Take 10 mg by mouth daily.   Yes [provider]  ondansetron (ZOFRAN) 4 MG tablet Take 4 mg by mouth every 6 (six) hours as needed. 04/25/21  Yes [provider]  tetrahydrozoline-zinc (VISINE-AC) 0.05-0.25 % ophthalmic solution Place 2 drops into both eyes 3 (three) times daily as needed.   Yes [provider]  acidophilus (RISAQUAD) CAPS Take 1 capsule by mouth daily. Patient not taking: Reported on 04/26/2021    [provider]  levocetirizine (XYZAL) 5 MG tablet Take 5 mg by mouth every other day. Patient not taking: Reported on 04/26/2021    [provider]  omeprazole (PRILOSEC) 20 MG capsule Take 1 po BID x 2 weeks then once a day Patient not taking: Reported on 04/26/2021 07/06/13   Rolland Porter, MD  Pediatric Multiple Vit-C-FA (FLINSTONES GUMMIES OMEGA-3 DHA PO) Take 1 tablet by mouth daily. Patient not taking: Reported on 04/26/2021    [provider]  venlafaxine (EFFEXOR) 75 MG tablet Take 75 mg by mouth 2 (two) times daily. Patient not taking: Reported on 04/26/2021    [provider]    Inpatient Medications:  Current Facility-Administered Medications:  .  albuterol (VENTOLIN HFA) 108 (90 Base) MCG/ACT inhaler 2 puff, 2 puff, Inhalation, Q4H PRN, Truett Mainland, DO .  Chlorhexidine Gluconate Cloth 2 % PADS 6 each, 6 each, Topical, Daily, Manuella Ghazi, Pratik D, DO, 6 each at 04/27/21 1002 .  citalopram (CELEXA) tablet 20 mg, 20 mg, Oral, Daily, Truett Mainland, DO, 20 mg at 04/27/21 1002 .  diazepam (VALIUM) tablet 2.5 mg, 2.5 mg, Oral, Q6H PRN, Truett Mainland, DO .  diltiazem (CARDIZEM CD) 24 hr capsule 240 mg, 240 mg, Oral, Daily, Truett Mainland, DO, 240 mg at 04/27/21 1002 .  feeding supplement (ENSURE ENLIVE / ENSURE PLUS) liquid 237 mL, 237 mL, Oral, BID BM, Stinson, Jacob J, DO .  folic acid  (FOLVITE) tablet 1 mg, 1 mg, Oral, Daily, Truett Mainland, DO, 1 mg at 04/27/21 1002 .  loratadine (CLARITIN) tablet 10 mg, 10 mg, Oral, Daily, Truett Mainland, DO, 10 mg at 04/27/21 1002 .  LORazepam (ATIVAN) tablet 1-4 mg, 1-4 mg, Oral, Q1H PRN **OR** LORazepam (ATIVAN) injection 1-4 mg, 1-4 mg, Intravenous, Q1H PRN, Truett Mainland, DO .  multivitamin with minerals tablet 1 tablet, 1 tablet, Oral, Daily, Truett Mainland, DO, 1 tablet at 04/27/21 1002 .  naphazoline-glycerin (CLEAR EYES REDNESS) ophth solution 1-2 drop, 1-2 drop, Both Eyes, QID PRN, Truett Mainland, DO .  nicotine (NICODERM  CQ - dosed in mg/24 hr) patch 7 mg, 7 mg, Transdermal, Daily, Manuella Ghazi, Pratik D, DO .  nicotine polacrilex (NICORETTE) gum 2 mg, 2 mg, Oral, PRN, Truett Mainland, DO .  ondansetron (ZOFRAN) tablet 4 mg, 4 mg, Oral, Q6H PRN **OR** ondansetron (ZOFRAN) injection 4 mg, 4 mg, Intravenous, Q6H PRN, Stinson, Jacob J, DO .  oxyCODONE (Oxy IR/ROXICODONE) immediate release tablet 5 mg, 5 mg, Oral, Q4H PRN, Stinson, Jacob J, DO .  pantoprazole (PROTONIX) 80 mg in sodium chloride 0.9 % 100 mL (0.8 mg/mL) infusion, 8 mg/hr, Intravenous, Continuous, Truett Mainland, DO, Last Rate: 10 mL/hr at 04/27/21 0600, 8 mg/hr at 04/27/21 0600 .  [START ON 04/30/2021] pantoprazole (PROTONIX) injection 40 mg, 40 mg, Intravenous, Q12H, Stinson, Jacob J, DO .  potassium chloride SA (KLOR-CON) CR tablet 40 mEq, 40 mEq, Oral, BID, Truett Mainland, DO, 40 mEq at 04/27/21 1002 .  thiamine tablet 100 mg, 100 mg, Oral, Daily, 100 mg at 04/27/21 1002 **OR** thiamine (B-1) injection 100 mg, 100 mg, Intravenous, Daily, Stinson, Jacob J, DO Allergies: Codeine and Other  Complete Review of Systems: GENERAL: negative for malaise, night sweats HEENT: No changes in hearing or vision, no nose bleeds or other nasal problems. NECK: Negative for lumps, goiter, pain and significant neck swelling RESPIRATORY: Negative for cough,  wheezing CARDIOVASCULAR: Negative for chest pain, leg swelling, palpitations, orthopnea GI: SEE HPI MUSCULOSKELETAL: Negative for joint pain or swelling, back pain, and muscle pain. SKIN: Negative for lesions, rash PSYCH: Negative for sleep disturbance, mood disorder and recent psychosocial stressors. HEMATOLOGY Negative for prolonged bleeding, bruising easily, and swollen nodes. ENDOCRINE: Negative for cold or heat intolerance, polyuria, polydipsia and goiter. NEURO: negative for tremor, gait imbalance, syncope and seizures. The remainder of the review of systems is noncontributory.  Physical Exam: BP (!) 166/87   Pulse 77   Temp 97.6 F (36.4 C) (Oral)   Resp 13   Ht 5' 5"  (1.651 m)   Wt 47.6 kg   SpO2 100%   BMI 17.46 kg/m  GENERAL: The patient is AO x3, in no acute distress. HEENT: Head is normocephalic and atraumatic. EOMI are intact. Mouth is well hydrated and without lesions. NECK: Supple. No masses LUNGS: Clear to auscultation. No presence of rhonchi/wheezing/rales. Adequate chest expansion HEART: RRR, normal s1 and s2. ABDOMEN: Soft, nontender, no guarding, no peritoneal signs, and nondistended. BS +. No masses. RECTAL EXAM: deferred EXTREMITIES: Without any cyanosis, clubbing, rash, lesions or edema. NEUROLOGIC: AOx3, no focal motor deficit. SKIN: no jaundice, no rashes  Laboratory Data CBC:     Component Value Date/Time   WBC 6.2 04/27/2021 0601   RBC 4.36 04/27/2021 0601   HGB 10.3 (L) 04/27/2021 0601   HCT 32.6 (L) 04/27/2021 0601   PLT 266 04/27/2021 0601   MCV 74.8 (L) 04/27/2021 0601   MCH 23.6 (L) 04/27/2021 0601   MCHC 31.6 04/27/2021 0601   RDW 18.1 (H) 04/27/2021 0601   LYMPHSABS 1.0 04/26/2021 1533   MONOABS 0.6 04/26/2021 1533   EOSABS 0.0 04/26/2021 1533   BASOSABS 0.0 04/26/2021 1533   COAG:  Lab Results  Component Value Date   INR 1.0 04/26/2021    BMP:  BMP Latest Ref Rng & Units 04/27/2021 04/27/2021 04/26/2021  Glucose 70 - 99 mg/dL 91  87 87  BUN 6 - 20 mg/dL <5(L) <5(L) 5(L)  Creatinine 0.44 - 1.00 mg/dL 0.54 0.62 0.61  Sodium 135 - 145 mmol/L 127(L) 124(L) 121(L)  Potassium 3.5 -  5.1 mmol/L 3.7 3.7 3.2(L)  Chloride 98 - 111 mmol/L 95(L) 91(L) 88(L)  CO2 22 - 32 mmol/L 24 24 24   Calcium 8.9 - 10.3 mg/dL 9.2 9.0 8.8(L)    HEPATIC:  Hepatic Function Latest Ref Rng & Units 04/26/2021 07/06/2013  Total Protein 6.5 - 8.1 g/dL 6.7 7.2  Albumin 3.5 - 5.0 g/dL 4.3 4.2  AST 15 - 41 U/L 43(H) 116(H)  ALT 0 - 44 U/L 30 60(H)  Alk Phosphatase 38 - 126 U/L 59 96  Total Bilirubin 0.3 - 1.2 mg/dL 0.5 0.4    CARDIAC: No results found for: CKTOTAL, CKMB, CKMBINDEX, TROPONINI   Imaging: I personally reviewed and interpreted the available imaging.  Assessment & Plan: MARCELIA PETERSEN is a 55 y.o. female with past medical history of depression, alcohol abuse, chronic smoking, who comes to the hospital after presenting melena and epigastric pain.  The patient has been taking ibuprofen chronically which possibly has led to peptic ulcer disease and her current presentation of upper gastrointestinal bleeding.  She has responded to transfusion of PRBC adequately.  As her sodium has improved and she is optimized from the cardiovascular standpoint, we will proceed with an EGD.  She will need to be kept on her PPI drip for now.  I advised her to avoid taking any NSAIDs in the future as this will lead to recurrent episodes of gastrointestinal bleeding.  I also advised her to avoid alcohol intake as this could lead to major reversible alterations in her liver.  The patient understood and agreed.  #Upper gastrointestinal bleeding # Chronic NSAID use # Alcohol abuse - Repeat CBC qday, transfuse if Hb <7 - Pantoprazole ggt - 2 large bore IV lines - Active T/S - Keep NPO - Avoid NSAIDs - Will proceed with EGD today - Alcohol cessation  Harvel Quale, MD Gastroenterology and Hepatology [facility]

## 2021-04-27 NOTE — Progress Notes (Signed)
PROGRESS NOTE    Deborah Walton  JXB:[id number] DOB: August 08, 1966 DOA: 04/26/2021 PCP: [facility]   Brief Narrative:   Deborah Walton is a 55 y.o. female with a history of depression, tobacco abuse, heavy alcohol use.  Patient presented with several months of epigastric pain with recent melena over the past several weeks.  Patient was admitted with chronic blood loss anemia in the setting of suspected upper GI bleed with NSAID use as well as alcohol use.  She is also noted to have significant hyponatremia related to alcohol use.  Assessment & Plan:   Principal Problem:   Acute hyponatremia Active Problems:   Alcohol use   Iron deficiency anemia secondary to blood loss (chronic)   Tobacco abuse   COPD with chronic bronchitis (HCC)   Hypokalemia   Acute hyponatremia -Related to beer portomania and malnutrition -Improved with PRBC transfusion -Continue to monitor BMP every 24 hours -Continue on gentle IV NS for now  Chronic blood loss anemia -Noted to have history of iron deficiency -Appreciate GI consultation with plans for EGD -PPI drip -Status post 2 unit PRBC transfusion with improvement -Monitor CBC in a.m.  History of COPD with tobacco abuse -Nicotine patch -DuoNebs as needed for shortness of breath or wheezing  Alcohol abuse -CIWA protocol  DVT prophylaxis: SCDs Code Status: Full Family Communication: Patient will call Disposition Plan:  Status is: Inpatient  Remains inpatient appropriate because:Ongoing diagnostic testing needed not appropriate for outpatient work up, IV treatments appropriate due to intensity of illness or inability to take PO and Inpatient level of care appropriate due to severity of illness   Dispo: The patient is from: Home              Anticipated d/c is to: Home              Patient currently is not medically stable to d/c.   Difficult to place patient No   Consultants:   GI  Procedures:   See  below  Antimicrobials:   None   Subjective: Patient seen and evaluated today with no new acute complaints or concerns. No acute concerns or events noted overnight.  She denies any further abdominal pain or overt bleeding overnight.  Objective: Vitals:   04/27/21 1000 04/27/21 1002 04/27/21 1100 04/27/21 1123  BP: (!) 166/87 (!) 166/87 (!) 142/69   Pulse: 77  88 75  Resp: 13  16 14   Temp:    97.9 F (36.6 C)  TempSrc:    Oral  SpO2: 100%  96% 100%  Weight:      Height:        Intake/Output Summary (Last 24 hours) at 04/27/2021 1156 Last data filed at 04/27/2021 1000 Gross per 24 hour  Intake 1640.54 ml  Output 2325 ml  Net -684.46 ml   Filed Weights   04/26/21 2114  Weight: 47.6 kg    Examination:  General exam: Appears calm and comfortable  Respiratory system: Clear to auscultation. Respiratory effort normal. Cardiovascular system: S1 & S2 heard, RRR.  Gastrointestinal system: Abdomen is soft Central nervous system: Alert and awake Extremities: No edema Skin: No significant lesions noted Psychiatry: Flat affect.    Data Reviewed: I have personally reviewed following labs and imaging studies  CBC: Recent Labs  Lab 04/26/21 1533 04/27/21 0601  WBC 5.2 6.2  NEUTROABS 3.6  --   HGB 6.8* 10.3*  HCT 21.7* 32.6*  MCV 68.0* 74.8*  PLT 330 266   Basic  Metabolic Panel: Recent Labs  Lab 04/26/21 1748 04/26/21 1749 04/26/21 2155 04/26/21 2354 04/27/21 0146 04/27/21 0420  NA  --  116* 118* 121* 124* 127*  K  --  3.0* 2.9* 3.2* 3.7 3.7  CL  --  79* 85* 88* 91* 95*  CO2  --  24 24 24 24 24   GLUCOSE  --  95 113* 87 87 91  BUN  --  5* 5* 5* <5* <5*  CREATININE  --  0.54 0.63 0.61 0.62 0.54  CALCIUM  --  8.8* 8.7* 8.8* 9.0 9.2  MG 2.0  --   --   --   --   --   PHOS 3.2  --   --   --   --   --    GFR: Estimated Creatinine Clearance: 59.7 mL/min (by C-G formula based on SCr of 0.54 mg/dL). Liver Function Tests: Recent Labs  Lab 04/26/21 1533  AST 43*   ALT 30  ALKPHOS 59  BILITOT 0.5  PROT 6.7  ALBUMIN 4.3   Recent Labs  Lab 04/26/21 1533  LIPASE 28   No results for input(s): AMMONIA in the last 168 hours. Coagulation Profile: Recent Labs  Lab 04/26/21 1533  INR 1.0   Cardiac Enzymes: No results for input(s): CKTOTAL, CKMB, CKMBINDEX, TROPONINI in the last 168 hours. BNP (last 3 results) No results for input(s): PROBNP in the last 8760 hours. HbA1C: No results for input(s): HGBA1C in the last 72 hours. CBG: No results for input(s): GLUCAP in the last 168 hours. Lipid Profile: No results for input(s): CHOL, HDL, LDLCALC, TRIG, CHOLHDL, LDLDIRECT in the last 72 hours. Thyroid Function Tests: No results for input(s): TSH, T4TOTAL, FREET4, T3FREE, THYROIDAB in the last 72 hours. Anemia Panel: No results for input(s): VITAMINB12, FOLATE, FERRITIN, TIBC, IRON, RETICCTPCT in the last 72 hours. Sepsis Labs: No results for input(s): PROCALCITON, LATICACIDVEN in the last 168 hours.  Recent Results (from the past 240 hour(s))  Resp Panel by RT-PCR (Flu A&B, Covid) Nasopharyngeal Swab     Status: None   Collection Time: 04/26/21  3:42 PM   Specimen: Nasopharyngeal Swab; Nasopharyngeal(NP) swabs in vial transport medium  Result Value Ref Range Status   SARS Coronavirus 2 by RT PCR NEGATIVE NEGATIVE Final    Comment: (NOTE) SARS-CoV-2 target nucleic acids are NOT DETECTED.  The SARS-CoV-2 RNA is generally detectable in upper respiratory specimens during the acute phase of infection. The lowest concentration of SARS-CoV-2 viral copies this assay can detect is 138 copies/mL. A negative result does not preclude SARS-Cov-2 infection and should not be used as the sole basis for treatment or other patient management decisions. A negative result may occur with  improper specimen collection/handling, submission of specimen other than nasopharyngeal swab, presence of viral mutation(s) within the areas targeted by this assay, and  inadequate number of viral copies(<138 copies/mL). A negative result must be combined with clinical observations, patient history, and epidemiological information. The expected result is Negative.  Fact Sheet for Patients:  06/26/21  Fact Sheet for Healthcare Providers:  BloggerCourse.com  This test is no t yet approved or cleared by the [place] FDA and  has been authorized for detection and/or diagnosis of SARS-CoV-2 by FDA under an Emergency Use Authorization (EUA). This EUA will remain  in effect (meaning this test can be used) for the duration of the COVID-19 declaration under Section 564(b)(1) of the Act, 21 U.S.C.section 360bbb-3(b)(1), unless the authorization is terminated  or revoked sooner.  Influenza A by PCR NEGATIVE NEGATIVE Final   Influenza B by PCR NEGATIVE NEGATIVE Final    Comment: (NOTE) The Xpert Xpress SARS-CoV-2/FLU/RSV plus assay is intended as an aid in the diagnosis of influenza from Nasopharyngeal swab specimens and should not be used as a sole basis for treatment. Nasal washings and aspirates are unacceptable for Xpert Xpress SARS-CoV-2/FLU/RSV testing.  Fact Sheet for Patients: BloggerCourse.com  Fact Sheet for Healthcare Providers: SeriousBroker.it  This test is not yet approved or cleared by the [place] FDA and has been authorized for detection and/or diagnosis of SARS-CoV-2 by FDA under an Emergency Use Authorization (EUA). This EUA will remain in effect (meaning this test can be used) for the duration of the COVID-19 declaration under Section 564(b)(1) of the Act, 21 U.S.C. section 360bbb-3(b)(1), unless the authorization is terminated or revoked.  Performed at [facility], [address], [place], [place] [address]   MRSA PCR Screening     Status: None   Collection Time: 04/26/21  9:12 PM   Specimen: Nasal Mucosa;  Nasopharyngeal  Result Value Ref Range Status   MRSA by PCR NEGATIVE NEGATIVE Final    Comment:        The GeneXpert MRSA Assay (FDA approved for NASAL specimens only), is one component of a comprehensive MRSA colonization surveillance program. It is not intended to diagnose MRSA infection nor to guide or monitor treatment for MRSA infections. Performed at [facility], [address], [place], [place] [address]          Radiology Studies: DG Chest Portable 1 View  Result Date: 04/26/2021 CLINICAL DATA:  Cough. EXAM: PORTABLE CHEST 1 VIEW COMPARISON:  May 14, 2010 FINDINGS: The lungs are hyperinflated. Mild, biapical atelectasis is noted. There is no evidence of acute infiltrate, pleural effusion or pneumothorax. The heart size and mediastinal contours are within normal limits. The visualized skeletal structures are unremarkable. 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 Electronically Signed   By: Aram Candela M.D.   On: 04/26/2021 16:19        Scheduled Meds: . Chlorhexidine Gluconate Cloth  6 each Topical Daily  . citalopram  20 mg Oral Daily  . diltiazem  240 mg Oral Daily  . feeding supplement  237 mL Oral BID BM  . folic acid  1 mg Oral Daily  . loratadine  10 mg Oral Daily  . multivitamin with minerals  1 tablet Oral Daily  . nicotine  7 mg Transdermal Daily  . [START ON 04/30/2021] pantoprazole  40 mg Intravenous Q12H  . potassium chloride  40 mEq Oral BID  . thiamine  100 mg Intravenous Daily   Or  . thiamine  100 mg Oral Daily   Continuous Infusions: . pantoprozole (PROTONIX) infusion 8 mg/hr (04/27/21 0600)     LOS: 1 day    Time spent: 35 minutes    Axxel Gude Hoover Brunette, DO [facility]  If 7PM-7AM, please contact night-coverage [email or web address] 04/27/2021, 11:56 AM

## 2021-04-27 NOTE — Anesthesia Postprocedure Evaluation (Signed)
Anesthesia Post Note  Patient: Deborah Walton  Procedure(s) Performed: ESOPHAGOGASTRODUODENOSCOPY (EGD) WITH PROPOFOL (N/A ) BIOPSY  Patient location during evaluation: PACU Anesthesia Type: General Level of consciousness: awake and alert and oriented Pain management: pain level controlled Vital Signs Assessment: post-procedure vital signs reviewed and stable Respiratory status: spontaneous breathing and respiratory function stable Cardiovascular status: blood pressure returned to baseline and stable Postop Assessment: no apparent nausea or vomiting Anesthetic complications: no   No complications documented.   Last Vitals:  Vitals:   04/27/21 1327 04/27/21 1358  BP: (!) 167/83 (!) 142/90  Pulse: 71 76  Resp: 13 14  Temp: 37 C 36.8 C  SpO2: 100% 100%    Last Pain:  Vitals:   04/27/21 1358  TempSrc:   PainSc: 0-No pain                 Montel Vanderhoof C Eldar Robitaille

## 2021-04-27 NOTE — Anesthesia Preprocedure Evaluation (Signed)
Anesthesia Evaluation  Patient identified by MRN, date of birth, ID band Patient awake    Reviewed: Allergy & Precautions, NPO status , Patient's Chart, lab work & pertinent test results  Airway Mallampati: II  TM Distance: >3 FB Neck ROM: Full    Dental  (+) Dental Advisory Given Temporary crown:   Pulmonary COPD,  COPD inhaler, Current Smoker,    Pulmonary exam normal breath sounds clear to auscultation       Cardiovascular Exercise Tolerance: Good Normal cardiovascular exam Rhythm:Regular Rate:Normal     Neuro/Psych PSYCHIATRIC DISORDERS Depression    GI/Hepatic GERD  ,(+)     substance abuse  alcohol use,   Endo/Other  negative endocrine ROS  Renal/GU negative Renal ROS     Musculoskeletal   Abdominal   Peds  Hematology  (+) anemia ,   Anesthesia Other Findings   Reproductive/Obstetrics                             Anesthesia Physical Anesthesia Plan  ASA: III  Anesthesia Plan: General   Post-op Pain Management:    Induction: Intravenous  PONV Risk Score and Plan: Propofol infusion  Airway Management Planned: Nasal Cannula and Natural Airway  Additional Equipment:   Intra-op Plan:   Post-operative Plan:   Informed Consent: I have reviewed the patients History and Physical, chart, labs and discussed the procedure including the risks, benefits and alternatives for the proposed anesthesia with the patient or authorized representative who has indicated his/her understanding and acceptance.       Plan Discussed with: CRNA and Surgeon  Anesthesia Plan Comments:         Anesthesia Quick Evaluation

## 2021-04-28 LAB — OSMOLALITY: Osmolality: 272 mOsm/kg — ABNORMAL LOW (ref 275–295)

## 2021-04-30 ENCOUNTER — Encounter (HOSPITAL_COMMUNITY): Payer: Self-pay | Admitting: Gastroenterology

## 2021-04-30 LAB — SURGICAL PATHOLOGY

## 2021-05-10 ENCOUNTER — Ambulatory Visit (HOSPITAL_COMMUNITY)
Admission: RE | Admit: 2021-05-10 | Discharge: 2021-05-10 | Disposition: A | Discharge status: Home / Self Care | Payer: 59 | Source: Ambulatory Visit | Attending: Neurosurgery | Admitting: Neurosurgery

## 2021-05-10 ENCOUNTER — Other Ambulatory Visit: Payer: Self-pay

## 2021-05-10 DIAGNOSIS — M5441 Lumbago with sciatica, right side: Secondary | ICD-10-CM | POA: Diagnosis not present

## 2021-05-10 MED ORDER — GADOBUTROL 1 MMOL/ML IV SOLN
5.0000 mL | Freq: Once | INTRAVENOUS | Status: AC | PRN
  Administered 2021-05-10: 5 mL via INTRAVENOUS

## 2021-09-13 ENCOUNTER — Other Ambulatory Visit: Payer: Self-pay | Admitting: Family Medicine

## 2021-09-13 ENCOUNTER — Other Ambulatory Visit: Payer: Self-pay

## 2021-09-13 DIAGNOSIS — Z1231 Encounter for screening mammogram for malignant neoplasm of breast: Secondary | ICD-10-CM

## 2021-09-26 ENCOUNTER — Ambulatory Visit
Admission: RE | Admit: 2021-09-26 | Discharge: 2021-09-26 | Disposition: A | Discharge status: Home / Self Care | Payer: 59 | Source: Ambulatory Visit | Attending: Family Medicine | Admitting: Family Medicine

## 2021-09-26 ENCOUNTER — Other Ambulatory Visit: Payer: Self-pay

## 2021-09-26 DIAGNOSIS — Z1231 Encounter for screening mammogram for malignant neoplasm of breast: Secondary | ICD-10-CM

## 2022-10-30 ENCOUNTER — Ambulatory Visit (HOSPITAL_COMMUNITY)
Admission: RE | Admit: 2022-10-30 | Discharge: 2022-10-30 | Disposition: A | Discharge status: Home / Self Care | Payer: 59 | Source: Ambulatory Visit | Attending: Family Medicine | Admitting: Family Medicine

## 2022-10-30 ENCOUNTER — Other Ambulatory Visit (HOSPITAL_COMMUNITY): Payer: Self-pay | Admitting: Family Medicine

## 2022-10-30 DIAGNOSIS — J40 Bronchitis, not specified as acute or chronic: Secondary | ICD-10-CM

## 2023-12-10 ENCOUNTER — Encounter (HOSPITAL_BASED_OUTPATIENT_CLINIC_OR_DEPARTMENT_OTHER): Payer: Self-pay | Admitting: Family Medicine

## 2023-12-10 ENCOUNTER — Ambulatory Visit (HOSPITAL_BASED_OUTPATIENT_CLINIC_OR_DEPARTMENT_OTHER): Payer: Medicaid Other | Admitting: Family Medicine

## 2023-12-10 VITALS — BP 142/88 | HR 75 | Ht 65.0 in | Wt 97.6 lb

## 2023-12-10 DIAGNOSIS — R238 Other skin changes: Secondary | ICD-10-CM | POA: Insufficient documentation

## 2023-12-10 DIAGNOSIS — R21 Rash and other nonspecific skin eruption: Secondary | ICD-10-CM

## 2023-12-10 DIAGNOSIS — L210 Seborrhea capitis: Secondary | ICD-10-CM

## 2023-12-10 DIAGNOSIS — Z122 Encounter for screening for malignant neoplasm of respiratory organs: Secondary | ICD-10-CM | POA: Diagnosis not present

## 2023-12-10 DIAGNOSIS — Z7689 Persons encountering health services in other specified circumstances: Secondary | ICD-10-CM

## 2023-12-10 MED ORDER — KETOCONAZOLE 2 % EX SHAM: MEDICATED_SHAMPOO | CUTANEOUS | 1 refills | Status: DC

## 2023-12-10 MED ORDER — TRIAMCINOLONE ACETONIDE 0.025 % EX OINT: 1.0000 | TOPICAL_OINTMENT | Freq: Two times a day (BID) | CUTANEOUS | 0 refills | Status: DC

## 2023-12-10 NOTE — Progress Notes (Signed)
New Patient Office Visit  Subjective:   Deborah Walton 01-May-1966 12/10/2023  Chief Complaint  Patient presents with   New Patient (Initial Visit)    Patient is here today to get established with the practice. States that she has a rash that is mainly on her scalp and on her back that she wants to discuss.    HPI: Deborah Walton presents today to establish care at Primary Care and Sports Medicine at [facility]. Introduced to Publishing rights manager role and practice setting.  All questions answered.   Last PCP: Phillips Odor MD [facility])  Last annual physical: Over 1 year  Concerns: See below   RASH: Onset: 8 months Location: Bilateral upper extremities, face, scalp, and back    Course of rash: Patient reports spread of rash to new sites over the past 8 months Treatments tried: Aveeno with mild relief; reports significant itching at night; she did change detergent to "free and clean", Head and Shoulders to scalp and using Dial soap               New medications/antibiotics: no  Tick/insect/pet exposure: no  Recent travel: no  New detergent, new clothing, or other topical exposure: no   Pruritis: yes  Tenderness: no  Feeling ill: no  Fever: no  Mouth lesions: no  Facial/tongue swelling/difficulty breathing:  no  Diabetic or immunocompromised: no    The following portions of the patient's history were reviewed and updated as appropriate: past medical history, past surgical history, family history, social history, allergies, medications, and problem list.   Patient Active Problem List   Diagnosis Date Noted   Acute hyponatremia 04/26/2021   Alcohol use 04/26/2021   Iron deficiency anemia secondary to blood loss (chronic) 04/26/2021   Tobacco abuse 04/26/2021   COPD with chronic bronchitis (HCC) 04/26/2021   Hypokalemia 04/26/2021   Depression    Degeneration of lumbar intervertebral disc 09/30/2018   Hip pain 09/30/2018   Past Medical  History:  Diagnosis Date   Depression    Environmental allergies    Hernia of abdominal wall    Smoker 07/07/11   Past Surgical History:  Procedure Laterality Date   BACK SURGERY     BIOPSY  04/27/2021   Procedure: BIOPSY;  Surgeon: Marguerita Merles, Reuel Boom, MD;  [facility]: [facility];  Service: Gastroenterology;;  small, bowel biopsies; duodenal ulcer scar biopsies;gastric biopsies   ESOPHAGOGASTRODUODENOSCOPY (EGD) WITH PROPOFOL N/A 04/27/2021   Procedure: ESOPHAGOGASTRODUODENOSCOPY (EGD) WITH PROPOFOL;  Surgeon: Dolores Frame, MD;  [facility]: [facility];  Service: Gastroenterology;  Laterality: N/A;   TONSILLECTOMY     History reviewed. No pertinent family history. Social History   Socioeconomic History   Marital status: Legally Separated    Spouse name: Not on file   Number of children: Not on file   Years of education: Not on file   Highest education level: Not on file  Occupational History   Not on file  Tobacco Use   Smoking status: Every Day    Current packs/day: 2.00    Average packs/day: 2.0 packs/day for 47.0 years (93.9 ttl pk-yrs)    Types: Cigarettes    Start date: 75   Smokeless tobacco: Never  Vaping Use   Vaping status: Former  Substance and Sexual Activity   Alcohol use: Yes    Comment: Daily. 6 beers daily "to gain weight"    Drug use: No   Sexual activity: Not on file  Other Topics Concern  Not on file  Social History Narrative   Not on file   Social Drivers of Health   Financial Resource Strain: High Risk (12/10/2023)   Overall Financial Resource Strain (CARDIA)    Difficulty of Paying Living Expenses: Hard  Food Insecurity: No Food Insecurity (12/10/2023)   Hunger Vital Sign    Worried About Running Out of Food in the Last Year: Never true    Ran Out of Food in the Last Year: Never true  Transportation Needs: No Transportation Needs (12/10/2023)   PRAPARE - Administrator, Civil Service (Medical): No    Lack of  Transportation (Non-Medical): No  Physical Activity: Sufficiently Active (12/10/2023)   Exercise Vital Sign    Days of Exercise per Week: 7 days    Minutes of Exercise per Session: 30 min  Stress: No Stress Concern Present (12/10/2023)   [employer] of Occupational Health - Occupational Stress Questionnaire    Feeling of Stress : Not at all  Social Connections: Socially Isolated (12/10/2023)   Social Connection and Isolation Panel [NHANES]    Frequency of Communication with Friends and Family: More than three times a week    Frequency of Social Gatherings with Friends and Family: Never    Attends Religious Services: Never    Database administrator or Organizations: No    Attends Banker Meetings: Never    Marital Status: Separated  Intimate Partner Violence: Not At Risk (12/10/2023)   Humiliation, Afraid, Rape, and Kick questionnaire    Fear of Current or Ex-Partner: No    Emotionally Abused: No    Physically Abused: No    Sexually Abused: No   Outpatient Medications Prior to Visit  Medication Sig Dispense Refill   albuterol (VENTOLIN HFA) 108 (90 Base) MCG/ACT inhaler Inhale 2 puffs into the lungs every 4 (four) hours as needed.     calcium carbonate (TUMS - DOSED IN MG ELEMENTAL CALCIUM) 500 MG chewable tablet Chew 2-4 tablets by mouth every 4 (four) hours as needed for heartburn.     Cyanocobalamin (VITAMIN B-12 CR PO) Take 1 tablet by mouth daily.     diazepam (VALIUM) 5 MG tablet Take 2.5 mg by mouth every 6 (six) hours as needed for anxiety or sleep.     fluticasone (FLONASE) 50 MCG/ACT nasal spray Place into both nostrils daily.     ibuprofen (ADVIL) 100 MG tablet Take 400 mg by mouth every 6 (six) hours as needed for fever.     Pediatric Multiple Vit-C-FA (FLINSTONES GUMMIES OMEGA-3 DHA PO) Take 1 tablet by mouth daily.     tetrahydrozoline-zinc (VISINE-AC) 0.05-0.25 % ophthalmic solution Place 2 drops into both eyes 3 (three) times daily as needed.      acidophilus (RISAQUAD) CAPS Take 1 capsule by mouth daily. (Patient not taking: Reported on 04/26/2021)     citalopram (CELEXA) 20 MG tablet Take 20 mg by mouth daily.     diltiazem (CARDIZEM CD) 240 MG 24 hr capsule Take 240 mg by mouth daily.     levocetirizine (XYZAL) 5 MG tablet Take 5 mg by mouth every other day. (Patient not taking: Reported on 04/26/2021)     loratadine (CLARITIN) 10 MG tablet Take 10 mg by mouth daily.     omeprazole (PRILOSEC) 20 MG capsule Take 1 po BID x 2 weeks then once a day (Patient not taking: Reported on 04/26/2021) 60 capsule 0   ondansetron (ZOFRAN) 4 MG tablet Take 4 mg by mouth every  6 (six) hours as needed.     venlafaxine (EFFEXOR) 75 MG tablet Take 75 mg by mouth 2 (two) times daily. (Patient not taking: Reported on 04/26/2021)     No facility-administered medications prior to visit.   Allergies  Allergen Reactions   Codeine    Other     "ALL PAIN MEDICATIONS"    ROS: A complete ROS was performed with pertinent positives/negatives noted in the HPI. The remainder of the ROS are negative.   Objective:   Today's Vitals   12/10/23 1338 12/10/23 1420  BP: (!) 167/99 (!) 142/88  Pulse: 75   SpO2: 100%   Weight: 97 lb 9.6 oz (44.3 kg)   Height: 5\' 5"  (1.651 m)     GENERAL: Well-appearing, in NAD. Well nourished.  SKIN: Pink, warm and dry. Small erythematous macular rash without pattern or cluster present to bilateral upper extremities and hands. No sign of rash to trunk, back, neck, or scalp. Mild dandruff and irritation present to scalp. No plaque formation.  Head: Normocephalic. NECK: Trachea midline. Full ROM w/o pain or tenderness.  EYES: Conjunctiva clear without exudates. EOMI, PERRL, no drainage present.  RESPIRATORY: Chest wall symmetrical. Respirations even and non-labored.  MSK: Muscle tone and strength appropriate for age. Joints w/o tenderness, redness, or swelling.  EXTREMITIES: Without clubbing, cyanosis, or edema.  NEUROLOGIC: No motor  or sensory deficits. Steady, even gait. C2-C12 intact.  PSYCH/MENTAL STATUS: Alert, oriented x 3. Cooperative, appropriate mood and affect.      Assessment & Plan:  1. Encounter to establish care with new doctor (Primary) Discussed role of PCP and health maintenance. Pt encouraged ot return for AE w/ fasting labs. Will obtain records from previous PCP.   Patient declines  mammogram, pap smear, DXA, routine vaccines, cscope. We discussed the risk of this means means there could be underlying issue or malignancy not identified, increased morbidity, and even mortality. Patient verbalized understanding and acceptance of risk. Patient knows they can change their mind at any time and we will be happy to coordinate these things for them.    2. Encounter for screening for lung cancer - CT CHEST LUNG CA SCREEN LOW DOSE W/O CM; Future  3. Rash Possible atopic dermatitis. Discussed possible triggers and to monitor for symptoms. Start kenalog ointment BID to affected areas and avoiding face. Referral placed to Dermatology if no improvement with ointment.   - triamcinolone (KENALOG) 0.025 % ointment; Apply 1 Application topically 2 (two) times daily.  Dispense: 30 g; Refill: 0 - Ambulatory referral to Dermatology  4. Scalp irritation Start Ketoconazole shampoo twice weekly up to 8 weeks for possible fungal irritation and dandruff w/ irritation. Recommend follow up with PCP or derm if no improvement.   - ketoconazole (NIZORAL) 2 % shampoo; Apply to wet hair, lather, and rinse thoroughly. Use every 3 to 4 days for up to 8 weeks; then apply only as needed to control dandruff.  Dispense: 120 mL; Refill: 1   Patient to reach out to office if new, worrisome, or unresolved symptoms arise or if no improvement in patient's condition. Patient verbalized understanding and is agreeable to treatment plan. All questions answered to patient's satisfaction.    Return in about 4 weeks (around 01/07/2024) for ANNUAL  PHYSICAL, BP follow up .    Hilbert Bible, [place]

## 2023-12-10 NOTE — Patient Instructions (Addendum)
[facility] Dermatology [phone]  Dermatologist [address]  [phone]  [facility] [phone]  S[facility] care clinic [address]  [phone]

## 2023-12-29 ENCOUNTER — Telehealth (HOSPITAL_BASED_OUTPATIENT_CLINIC_OR_DEPARTMENT_OTHER): Payer: Self-pay | Admitting: Family Medicine

## 2023-12-29 NOTE — Telephone Encounter (Signed)
 Called and spoke with pt who believes she has a UTI.  Patient states that she does have urinary frequency and also has pain/burning when she urinates.   States that she thought she had a yeast infection and states that she did use the monostat OTC which was not working so now she feels like it might be a UTI.  Patient is going to get some cranberry juice to see if that might help. Patient does have an appt with PCP 12/30/23 at 1:50. Told pt we will have her do a UA so we can see if she does in fact have a UTI as told pt without this we could not send in any meds and she verbalized understanding.

## 2023-12-29 NOTE — Telephone Encounter (Signed)
 Pt called wanting to know if she could get an antibiotic called in today before her appt tomorrow or if she would have to wait please contact patient at [phone]

## 2023-12-30 ENCOUNTER — Telehealth (HOSPITAL_BASED_OUTPATIENT_CLINIC_OR_DEPARTMENT_OTHER): Payer: Self-pay | Admitting: *Deleted

## 2023-12-30 ENCOUNTER — Encounter (HOSPITAL_BASED_OUTPATIENT_CLINIC_OR_DEPARTMENT_OTHER): Payer: Self-pay | Admitting: Family Medicine

## 2023-12-30 ENCOUNTER — Ambulatory Visit (HOSPITAL_BASED_OUTPATIENT_CLINIC_OR_DEPARTMENT_OTHER): Payer: Medicaid Other | Admitting: Family Medicine

## 2023-12-30 ENCOUNTER — Ambulatory Visit (HOSPITAL_BASED_OUTPATIENT_CLINIC_OR_DEPARTMENT_OTHER): Payer: Self-pay | Admitting: Family Medicine

## 2023-12-30 VITALS — BP 126/78 | HR 83 | Ht 65.0 in | Wt 100.7 lb

## 2023-12-30 DIAGNOSIS — R3989 Other symptoms and signs involving the genitourinary system: Secondary | ICD-10-CM

## 2023-12-30 DIAGNOSIS — R21 Rash and other nonspecific skin eruption: Secondary | ICD-10-CM | POA: Diagnosis not present

## 2023-12-30 LAB — POCT URINALYSIS DIP (CLINITEK)
Bilirubin, UA: NEGATIVE
Blood, UA: NEGATIVE
Glucose, UA: NEGATIVE mg/dL
Ketones, POC UA: NEGATIVE mg/dL
Nitrite, UA: NEGATIVE
POC PROTEIN,UA: NEGATIVE
Spec Grav, UA: 1.015 (ref 1.010–1.025)
Urobilinogen, UA: 0.2 U/dL
pH, UA: 5.5 (ref 5.0–8.0)

## 2023-12-30 MED ORDER — NYSTATIN-TRIAMCINOLONE 100000-0.1 UNIT/GM-% EX OINT: 1.0000 | TOPICAL_OINTMENT | Freq: Two times a day (BID) | CUTANEOUS | 3 refills | Status: DC

## 2023-12-30 NOTE — Addendum Note (Signed)
 Addended by: Wyvonne Lenz on: 12/30/2023 03:55 PM   Modules accepted: Orders

## 2023-12-30 NOTE — Telephone Encounter (Signed)
 Copied from CRM [id number]. Topic: Clinical - Red Word Triage >> Dec 30, 2023  5:14 PM Elle L wrote: Red Word that prompted transfer to Nurse Triage: The patient is in severe pain from her yeast infection. She states she is red and swollen.   Reason for Disposition  [1] Follow-up call to recent contact AND [2] information only call, no triage required  Answer Assessment - Initial Assessment Questions 1. REASON FOR CALL or QUESTION: What is your reason for calling today? or How can I best help you? or What question do you have that I can help answer?     Patient calling again about the pharmacy not filling her prescription due to a problem with her insurance. Patient advised that the office is aware and will get to it as soon as they can. Patient stated she would call back tomorrow to check on the status.  Protocols used: Information Only Call - No Triage-A-AH

## 2023-12-30 NOTE — Progress Notes (Signed)
 Acute Care Office Visit  Subjective:   Deborah Walton Jan 15, 1966 12/30/2023  Chief Complaint  Patient presents with   Urinary Tract Infection    Patient states she has been having burning when she urinates and also states that she has been having swelling in the groin area. States that she has been using ice to see if that would help with the burning/itching.    HPI: URINARY SYMPTOMS Onset: 1 week.   Pt reports burning, itching and swelling to labial area. She has been using Vagisil and eating yogurt, drinking cranberry juice for the past week without relief. Denies dysuria or burning.   Fever/chills: no Dysuria: NO Urinary frequency: no Urgency: no Foul odor: no Urinary incontinence: no Hematuria: no Abdominal pain: no Suprapubic pain/pressure:  No Flank/low back pain: no Nausea/Vomiting: no  Treatments tried: Vagisil   Previous urinary tract infection: no Recurrent urinary tract infection: no History of sexually transmitted disease: no    The following portions of the patient's history were reviewed and updated as appropriate: past medical history, past surgical history, family history, social history, allergies, medications, and problem list.   Patient Active Problem List   Diagnosis Date Noted   Scalp irritation 12/10/2023   Rash 12/10/2023   Acute hyponatremia 04/26/2021   Alcohol use 04/26/2021   Iron deficiency anemia secondary to blood loss (chronic) 04/26/2021   Tobacco abuse 04/26/2021   COPD with chronic bronchitis (HCC) 04/26/2021   Hypokalemia 04/26/2021   Depression    Degeneration of lumbar intervertebral disc 09/30/2018   Hip pain 09/30/2018   Past Medical History:  Diagnosis Date   Depression    Environmental allergies    Hernia of abdominal wall    Smoker 07/07/11   Past Surgical History:  Procedure Laterality Date   BACK SURGERY     BIOPSY  04/27/2021   Procedure: BIOPSY;  Surgeon: Eartha Flavors, Toribio, MD;  [facility];  Service: Gastroenterology;;  small, bowel biopsies; duodenal ulcer scar biopsies;gastric biopsies   ESOPHAGOGASTRODUODENOSCOPY (EGD) WITH PROPOFOL  N/A 04/27/2021   Procedure: ESOPHAGOGASTRODUODENOSCOPY (EGD) WITH PROPOFOL ;  Surgeon: Eartha Flavors Toribio, MD;  [facility];  Service: Gastroenterology;  Laterality: N/A;   TONSILLECTOMY     History reviewed. No pertinent family history. Outpatient Medications Prior to Visit  Medication Sig Dispense Refill   albuterol  (VENTOLIN  HFA) 108 (90 Base) MCG/ACT inhaler Inhale 2 puffs into the lungs every 4 (four) hours as needed.     calcium carbonate (TUMS - DOSED IN MG ELEMENTAL CALCIUM) 500 MG chewable tablet Chew 2-4 tablets by mouth every 4 (four) hours as needed for heartburn.     Cyanocobalamin (VITAMIN B-12 CR PO) Take 1 tablet by mouth daily.     diazepam  (VALIUM ) 5 MG tablet Take 2.5 mg by mouth every 6 (six) hours as needed for anxiety or sleep.     fluticasone (FLONASE) 50 MCG/ACT nasal spray Place into both nostrils daily.     ibuprofen (ADVIL) 100 MG tablet Take 400 mg by mouth every 6 (six) hours as needed for fever.     ketoconazole  (NIZORAL ) 2 % shampoo Apply to wet hair, lather, and rinse thoroughly. Use every 3 to 4 days for up to 8 weeks; then apply only as needed to control dandruff. 120 mL 1   Pediatric Multiple Vit-C-FA (FLINSTONES GUMMIES OMEGA-3 DHA PO) Take 1 tablet by mouth daily.     tetrahydrozoline-zinc (VISINE-AC) 0.05-0.25 % ophthalmic solution Place 2 drops into  both eyes 3 (three) times daily as needed.     triamcinolone  (KENALOG ) 0.025 % ointment Apply 1 Application topically 2 (two) times daily. 30 g 0   No facility-administered medications prior to visit.   Allergies  Allergen Reactions   Codeine    Other     ALL PAIN MEDICATIONS     ROS: A complete ROS was performed with pertinent positives/negatives noted in the HPI. The remainder of the ROS are negative.    Objective:    Today's Vitals   12/30/23 1331  BP: 126/78  Pulse: 83  SpO2: 100%  Weight: 100 lb 11.2 oz (45.7 kg)  Height: 5' 5 (1.651 m)    GENERAL: Well-appearing, in NAD. Well nourished.  SKIN: Pink, warm and dry. No rash, lesion, ulceration, or ecchymoses.  Head: Normocephalic. NECK: Trachea midline. Full ROM w/o pain or tenderness.  CARDIAC: S1, S2 present, regular rate and rhythm without murmur or gallops. Peripheral pulses 2+ bilaterally.  GU: External genitalia with moderate erythema to bilateral labia. No lesions, or masses. No lymphadenopathy. Vaginal mucosa pink and moist without exudate, lesions, or ulcerations.  Chaperoned: Pt Declined chaperone.  MSK: Muscle tone and strength appropriate for age.  EXTREMITIES: Without clubbing, cyanosis, or edema.  NEUROLOGIC: No motor or sensory deficits. Steady, even gait. C2-C12 intact.  PSYCH/MENTAL STATUS: Alert, oriented x 3. Cooperative, appropriate mood and affect.       Assessment & Plan:  1. Rash (Primary) Urine negative for acute infection in office. Likely candida related rash present to external vaginal area causing erythema and itching. Will start Nystatin -triamcinolone  cream BID for the next 7 days. No signs of vaginal discharge or vaginal yeast infection or BV. Declined vaginal testing and STI testing. Will return if no improvement in 48 hours.    Meds ordered this encounter  Medications   nystatin -triamcinolone  ointment (MYCOLOG)    Sig: Apply 1 Application topically 2 (two) times daily.    Dispense:  60 g    Refill:  3    Supervising Provider:   DE CUBA, RAYMOND J [[id number]]   Return if symptoms worsen or fail to improve.    Patient to reach out to office if new, worrisome, or unresolved symptoms arise or if no improvement in patient's condition. Patient verbalized understanding and is agreeable to treatment plan. All questions answered to patient's satisfaction.    Deborah Walton, [place]

## 2023-12-30 NOTE — Telephone Encounter (Signed)
Alexis, please advise on this.

## 2023-12-30 NOTE — Telephone Encounter (Signed)
 Copied from CRM [id number]. Topic: Clinical - Prescription Issue >> Dec 30, 2023  2:36 PM Delon DASEN wrote: Reason for CRM: The pharmacy is telling her the prescription that was called in has a message that the insurance company wants to know why it was prescribed and if there is anything cheaper.  Please call patient [phone]

## 2023-12-31 ENCOUNTER — Telehealth (HOSPITAL_BASED_OUTPATIENT_CLINIC_OR_DEPARTMENT_OTHER): Payer: Self-pay

## 2023-12-31 ENCOUNTER — Encounter (HOSPITAL_BASED_OUTPATIENT_CLINIC_OR_DEPARTMENT_OTHER): Payer: Self-pay | Admitting: Family Medicine

## 2023-12-31 ENCOUNTER — Telehealth (HOSPITAL_BASED_OUTPATIENT_CLINIC_OR_DEPARTMENT_OTHER): Payer: Self-pay | Admitting: *Deleted

## 2023-12-31 MED ORDER — TRIAMCINOLONE ACETONIDE 0.5 % EX OINT: 1.0000 | TOPICAL_OINTMENT | Freq: Two times a day (BID) | CUTANEOUS | 0 refills | Status: DC

## 2023-12-31 MED ORDER — NYSTATIN 100000 UNIT/GM EX OINT: 1.0000 | TOPICAL_OINTMENT | Freq: Two times a day (BID) | CUTANEOUS | 0 refills | Status: DC

## 2023-12-31 NOTE — Telephone Encounter (Signed)
 Copied from CRM [id number]. Topic: General - Other >> Dec 31, 2023 11:46 AM Susanna ORN wrote: Reason for CRM: Patient calling back to have nurse reach out to her. Advised that they are working on her issue but she's wanting someone to give her a call. CB #: [phone].

## 2023-12-31 NOTE — Telephone Encounter (Signed)
 Called pt's insurance company at the phone number provided stating to them that we were told that the Rx prescribed was not covered by insurance.  Was transferred to the pharmacy associated with pt's insurance. Found out that the nystatin -triamcinolone  combination ointment is considered a tier 3 which is nonpreferred with pt's insurance.    Did find out that the nystatin  (mycostatin ) ointment is considered a Tier 1 and the triamcinolone  0.5% ointment is also a Tier 1 with pt's insurance.   With Thersia being out of the office, Evalene, please advise if you think it is okay for us  to send both of these ointments to the pharmacy for pt to make up the combined med that was originally prescribed for pt?   Pt has a bad rash/yeast infection and she needs to have something prescribed to help out.

## 2023-12-31 NOTE — Telephone Encounter (Signed)
 Please refer to encounter from 12/31/23.

## 2023-12-31 NOTE — Telephone Encounter (Signed)
 Please advise.  Copied from CRM [id number]. Topic: Clinical - Prescription Issue >> Dec 31, 2023  8:48 AM Susanna ORN wrote: Reason for CRM: Patient called stating that the insurance company will not approve the medication that she prescribed for pt on yesterday. She states the insurance company wants to know what it's for. She would like for Dr. Knute to try to get this approved or either prescribe something else. Pt provided her insurance info & number to call. [place] [employer] [place], [phone]. Pt states she is really needing something.

## 2023-12-31 NOTE — Addendum Note (Signed)
 Addended by: Wyvonne Lenz on: 12/31/2023 04:27 PM   Modules accepted: Orders

## 2023-12-31 NOTE — Telephone Encounter (Signed)
 Nystatin ointment and triamcinolone ointments have been sent to preferred pharmacy for pt. Called and spoke with pt letting her know this had been done and she verbalized understanding. Nothing further needed.

## 2024-01-05 ENCOUNTER — Other Ambulatory Visit (HOSPITAL_BASED_OUTPATIENT_CLINIC_OR_DEPARTMENT_OTHER): Payer: Self-pay | Admitting: Family Medicine

## 2024-01-05 DIAGNOSIS — L210 Seborrhea capitis: Secondary | ICD-10-CM

## 2024-01-07 ENCOUNTER — Ambulatory Visit (HOSPITAL_BASED_OUTPATIENT_CLINIC_OR_DEPARTMENT_OTHER): Payer: Medicaid Other | Admitting: Family Medicine

## 2024-01-07 ENCOUNTER — Other Ambulatory Visit (HOSPITAL_BASED_OUTPATIENT_CLINIC_OR_DEPARTMENT_OTHER): Payer: Self-pay | Admitting: Family Medicine

## 2024-01-11 ENCOUNTER — Other Ambulatory Visit (HOSPITAL_BASED_OUTPATIENT_CLINIC_OR_DEPARTMENT_OTHER): Payer: Self-pay | Admitting: Family Medicine

## 2024-01-13 ENCOUNTER — Other Ambulatory Visit (HOSPITAL_BASED_OUTPATIENT_CLINIC_OR_DEPARTMENT_OTHER): Payer: Self-pay | Admitting: Family Medicine

## 2024-01-13 NOTE — Telephone Encounter (Signed)
Copied from CRM [id number]. Topic: Clinical - Medication Refill >> Jan 13, 2024  8:22 AM Dennison Nancy wrote: Most Recent Primary Care Visit:  Provider: Hilbert Bible  Department: [facility]  Visit Type: OFFICE VISIT  Date: 12/30/2023  Medication: nystatin ointment (MYCOSTATIN)  Has the patient contacted their pharmacy? Yes , waiting for the provider to prescribe it  (Agent: If no, request that the patient contact the pharmacy for the refill. If patient does not wish to contact the pharmacy document the reason why and proceed with request.) (Agent: If yes, when and what did the pharmacy advise?)  Is this the correct pharmacy for this prescription? Yes If no, delete pharmacy and type the correct one.  This is the patient's preferred pharmacy:  [facility] - [place], [place] - [address] [place] [address] Phone: [phone] Fax: [phone]   Has the prescription been filled recently? No  Is the patient out of the medication? No , only have a few days  Has the patient been seen for an appointment in the last year OR does the patient have an upcoming appointment? Yes  Can we respond through MyChart? No , prefer a call   Agent: Please be advised that Rx refills may take up to 3 business days. We ask that you follow-up with your pharmacy.

## 2024-01-19 ENCOUNTER — Telehealth (HOSPITAL_BASED_OUTPATIENT_CLINIC_OR_DEPARTMENT_OTHER): Payer: Self-pay | Admitting: Family Medicine

## 2024-01-19 ENCOUNTER — Other Ambulatory Visit (HOSPITAL_BASED_OUTPATIENT_CLINIC_OR_DEPARTMENT_OTHER): Payer: Self-pay | Admitting: Family Medicine

## 2024-01-19 MED ORDER — NYSTATIN 100000 UNIT/GM EX OINT: 1.0000 | TOPICAL_OINTMENT | Freq: Two times a day (BID) | CUTANEOUS | 0 refills | Status: DC

## 2024-01-19 NOTE — Telephone Encounter (Signed)
Rx has been resent to pharmacy

## 2024-01-19 NOTE — Telephone Encounter (Signed)
Copied from CRM [id number]. Topic: Clinical - Medication Refill >> Jan 19, 2024 10:20 AM Gildardo Pounds wrote: Most Recent Primary Care Visit:  Provider: Hilbert Bible  Department: [facility]  Visit Type: OFFICE VISIT  Date: 12/30/2023  Medication: nystatin ointment (MYCOSTATIN)   Has the patient contacted their pharmacy? Yes (Agent: If no, request that the patient contact the pharmacy for the refill. If patient does not wish to contact the pharmacy document the reason why and proceed with request.) Patient has called  (Agent: If yes, when and what did the pharmacy advise?)  Is this the correct pharmacy for this prescription? Yes If no, delete pharmacy and type the correct one.  This is the patient's preferred pharmacy:  [facility] - [place], [place] - [address] [place] [address] Phone: [phone] Fax: [phone]   Has the prescription been filled recently? Yes  Is the patient out of the medication? Yes  Has the patient been seen for an appointment in the last year OR does the patient have an upcoming appointment? Yes  Can we respond through MyChart? No  Agent: Please be advised that Rx refills may take up to 3 business days. We ask that you follow-up with your pharmacy.

## 2024-01-19 NOTE — Telephone Encounter (Signed)
Pt called about her nystatin prescription. She said the pharmacy never received it and wanted to know if it could be sent again please contact pt if needed

## 2024-01-25 ENCOUNTER — Ambulatory Visit (HOSPITAL_BASED_OUTPATIENT_CLINIC_OR_DEPARTMENT_OTHER): Payer: Self-pay | Admitting: Family Medicine

## 2024-01-25 NOTE — Telephone Encounter (Signed)
Copied from CRM [id number]. Topic: Clinical - Red Word Triage >> Jan 25, 2024  9:33 AM Alphonzo Lemmings O wrote: [facility] that prompted transfer to Nurse Triage: patient says she has been getting treatment for 3 months and she is not better groin is swollen and red and its burning  and itchy   Chief Complaint: Groin swelling/itching Symptoms: Swelling of the groin area, itchiness, pain to the groin area  Frequency: Constant  Disposition: [] ED /[] Urgent Care (no appt availability in office) / [x] Appointment(In office/virtual)/ []  [place] [facility]/ [] Home Care/ [] Refused Recommended Disposition /[] [place] [facility]/ []  Follow-up with PCP Additional Notes: Patient reports she has been dealing with a yeast infection for the last 3 months. She states that her symptoms have not improved since she was prescribed medication for it. Patient reports the itchiness has been waking her up at night and that her groin is swollen. Appointment made for the patient tomorrow.     Reason for Disposition  MODERATE-SEVERE itching (i.e., interferes with school, work, or sleep)  Answer Assessment - Initial Assessment Questions 1. SYMPTOM: "What's the main symptom you're concerned about?" (e.g., pain, itching, dryness)     Itchiness and swelling 2. LOCATION: "Where is the itching and swelling located?" (e.g., inside/outside, left/right)     Groin/thighs 3. ONSET: "When did the symptoms start?"     Approximately 3 months ago 4. PAIN: "Is there any pain?" If Yes, ask: "How bad is it?" (Scale: 1-10; mild, moderate, severe)   -  MILD (1-3): Doesn't interfere with normal activities.    -  MODERATE (4-7): Interferes with normal activities (e.g., work or school) or awakens from sleep.     -  SEVERE (8-10): Excruciating pain, unable to do any normal activities.     Severe 5. ITCHING: "Is there any itching?" If Yes, ask: "How bad is it?" (Scale: 1-10; mild, moderate, severe)     Severe 6. CAUSE: "What do you think is  causing the discharge?" "Have you had the same problem before? What happened then?"     Yeast infection  7. OTHER SYMPTOMS: "Do you have any other symptoms?" (e.g., fever, itching, vaginal bleeding, pain with urination, injury to genital area, vaginal foreign body)     Swelling to groin and inner thighs  Protocols used: Vaginal Symptoms-A-AH

## 2024-01-25 NOTE — Telephone Encounter (Signed)
Copied from CRM [id number]. Topic: General - Other >> Jan 25, 2024  9:27 AM Alphonzo Lemmings O wrote: Reason for CRM: patient needs to speak to office manager . Patient says she has been having treatment for 3 months and its not working. Groin is swollen and red . Patient cursed at me and wants to speak with a office manager . She will call back she is not in office . Call back [phone] daphanie and patient says rush it

## 2024-01-25 NOTE — Telephone Encounter (Signed)
Called pt she is just frustrated she is not getting better pt scheduled 2/4 let her know we would call her if we had cancellaton this afternoon she is 45 mins out

## 2024-01-25 NOTE — Telephone Encounter (Signed)
 Called pt.

## 2024-01-26 ENCOUNTER — Ambulatory Visit (INDEPENDENT_AMBULATORY_CARE_PROVIDER_SITE_OTHER): Payer: Medicaid Other | Admitting: Family Medicine

## 2024-01-26 ENCOUNTER — Encounter (HOSPITAL_BASED_OUTPATIENT_CLINIC_OR_DEPARTMENT_OTHER): Payer: Self-pay | Admitting: Family Medicine

## 2024-01-26 VITALS — BP 172/96 | HR 91 | Ht 65.0 in | Wt 98.3 lb

## 2024-01-26 DIAGNOSIS — R21 Rash and other nonspecific skin eruption: Secondary | ICD-10-CM | POA: Diagnosis not present

## 2024-01-26 DIAGNOSIS — L210 Seborrhea capitis: Secondary | ICD-10-CM

## 2024-01-26 MED ORDER — KETOCONAZOLE 2 % EX SHAM: MEDICATED_SHAMPOO | CUTANEOUS | 1 refills | Status: DC

## 2024-01-26 MED ORDER — CLOTRIMAZOLE 1 % EX CREA: 1.0000 | TOPICAL_CREAM | Freq: Two times a day (BID) | CUTANEOUS | 1 refills | Status: DC

## 2024-01-26 NOTE — Progress Notes (Signed)
    Procedures performed today:    None.  Independent interpretation of notes and tests performed by another provider:   None.  Brief History, Exam, Impression, and Recommendations:    BP (!) 172/96 (BP Location: Right Arm, Patient Position: Sitting, Cuff Size: Normal)   Pulse 91   Ht 5' 5 (1.651 m)   Wt 98 lb 4.8 oz (44.6 kg)   SpO2 97%   BMI 16.36 kg/m   Rash Assessment & Plan: Patient presents for evaluation of ongoing rash.  She was last seen about 4 weeks ago for current issue.  At that time, she seen her PCP here in our office, Alexis.  Treatment was initiated with topical nystatin  and triamcinolone .  She reports that she has been utilizing topical treatments, however has not had significant relief.  She continues to have itching and pain in affected area within groin.  She reports that she has needed to use cold water  bottle and ice pack to help with reducing symptoms.  Denies any other affected areas with similar rash.  Denies any dysuria.  Has not noted any oozing or drainage associated with rash. On exam, patient is in no acute distress.  Assessment of affected area shows external genitalia with mild to moderate erythema, no vesicles or lesions noted.  No current discharge or drainage.  Chaperone: Kerri Fuel. Suspect fungal etiology, will make change to current topical therapy.  We will have patient stop triamcinolone  and nystatin  and have her start clotrimazole  cream.  Within differential is lichen sclerosus. She was previously referred to dermatology, it does appear that this has been processed, however patient has not heard from office as of yet.  Did provide phone number for dermatology office for her to reach out and schedule appointment   Dandruff -     Ketoconazole ; APPLY TO WET HAIR, LATHER & RINSE THOROUGHLY EVERY 3-4 DAYS FOR UP TO 8 WEEKS. THEN APPLY ONLY AS NEEDED TO CONTROL DANDRUFF  Dispense: 120 mL; Refill: 1  Other orders -     Clotrimazole ; Apply 1  Application topically 2 (two) times daily.  Dispense: 30 g; Refill: 1   ___________________________________________ Xzavion Doswell de [place], MD, ABFM, CAQSM Primary Care and Sports Medicine [facility]

## 2024-01-26 NOTE — Patient Instructions (Signed)

## 2024-01-26 NOTE — Assessment & Plan Note (Addendum)
 Patient presents for evaluation of ongoing rash.  She was last seen about 4 weeks ago for current issue.  At that time, she seen her PCP here in our office, Alexis.  Treatment was initiated with topical nystatin  and triamcinolone .  She reports that she has been utilizing topical treatments, however has not had significant relief.  She continues to have itching and pain in affected area within groin.  She reports that she has needed to use cold water  bottle and ice pack to help with reducing symptoms.  Denies any other affected areas with similar rash.  Denies any dysuria.  Has not noted any oozing or drainage associated with rash. On exam, patient is in no acute distress.  Assessment of affected area shows external genitalia with mild to moderate erythema, no vesicles or lesions noted.  No current discharge or drainage.  Chaperone: Kerri Fuel. Suspect fungal etiology, will make change to current topical therapy.  We will have patient stop triamcinolone  and nystatin  and have her start clotrimazole  cream.  Within differential is lichen sclerosus. She was previously referred to dermatology, it does appear that this has been processed, however patient has not heard from office as of yet.  Did provide phone number for dermatology office for her to reach out and schedule appointment

## 2024-02-04 ENCOUNTER — Ambulatory Visit (HOSPITAL_BASED_OUTPATIENT_CLINIC_OR_DEPARTMENT_OTHER): Payer: Self-pay | Admitting: Family Medicine

## 2024-02-04 ENCOUNTER — Other Ambulatory Visit (HOSPITAL_BASED_OUTPATIENT_CLINIC_OR_DEPARTMENT_OTHER): Payer: Self-pay | Admitting: Family Medicine

## 2024-02-04 NOTE — Telephone Encounter (Signed)
Sending to Dr Tommi Rumps [place] as an FYI since pt has the upcoming appt with him.

## 2024-02-04 NOTE — Telephone Encounter (Signed)
Pt is scheduled 2/20 for acute visit. Pt would like to keep this appt she does not want to be referred to obgyn. I asked about dermatology referral patient stated they did not take her insurance so she did not schedule. Asked why she didn't let us know they didn't take the insurance and she said they just dont.  Pt stated medication is working. She would like refill on medication. Wanted to know if something stronger could be called in advised no that was the reasoning behind the referral. Pt would like to know if you would just refill what she was already on

## 2024-02-04 NOTE — Telephone Encounter (Signed)
  Chief Complaint: increased vaginal and inner thigh pain Symptoms: pain, swelling,  Frequency: a few weeks Pertinent Negatives: Patient denies fever, Disposition: [] ED /[] Urgent Care (no appt availability in office) / [x] Appointment(In office/virtual)/ []  [place] [facility]/ [] Home Care/ [x] Refused Recommended Disposition /[] [place] [facility]/ []  Follow-up with PCP  Additional Notes: Pt was recently see for this and dx with a yeast infection. Pt states that it seems to be getting worse. Pt states that she only wants to see MD RC, offered appts today with NP and pt declined. Pt is already sched with preferred MD 1 week from today. Pt states that she is utilizing the topical as well as sitting on ice, but she has back problems and this sometimes can hurt her back.   Copied from CRM [id number]. Topic: Clinical - Medical Advice >> Feb 04, 2024  8:52 AM Fuller Mandril wrote: Reason for CRM: Patient called in request appt to see Dr Tommi Rumps [place] [facility]. Saw him last on 2/4 for yeast infection. Patient states its not getting better. Scheduled 1st appt for 2/20 and added to [facility].  Wanted to inform provider she is not getting better. Thank You Reason for Disposition  MODERATE-SEVERE itching (i.e., interferes with school, work, or sleep)  Answer Assessment - Initial Assessment Questions 1. SYMPTOM: "What's the main symptom you're concerned about?" (e.g., pain, itching, dryness)     Itching, burning and swelling 2. LOCATION: "Where is the itching located?" (e.g., inside/outside, left/right)     vagina 3. ONSET: "When did the  itching  start?"     I can't remember, a few weeks, a long time 4. PAIN: "Is there any pain?" If Yes, ask: "How bad is it?" (Scale: 1-10; mild, moderate, severe)   -  MILD (1-3): Doesn't interfere with normal activities.    -  MODERATE (4-7): Interferes with normal activities (e.g., work or school) or awakens from sleep.     -  SEVERE (8-10): Excruciating pain, unable to do any normal  activities.     extreme 5. ITCHING: "Is there any itching?" If Yes, ask: "How bad is it?" (Scale: 1-10; mild, moderate, severe)     Yes, severe 6. CAUSE: "What do you think is causing the discharge?" "Have you had the same problem before? What happened then?"     Known yeast infection 7. OTHER SYMPTOMS: "Do you have any other symptoms?" (e.g., fever, itching, vaginal bleeding, pain with urination, injury to genital area, vaginal foreign body)     Itching,  Protocols used: Vaginal Symptoms-A-AH

## 2024-02-04 NOTE — Telephone Encounter (Signed)
Copied from CRM [id number]. Topic: Clinical - Medication Refill >> Feb 04, 2024 10:52 AM Shelah Lewandowsky wrote: Most Recent Primary Care Visit:  Provider: DE Peru, RAYMOND J  Department: [facility]  Visit Type: ACUTE  Date: 01/26/2024  Medication: diazepam (VALIUM) 5 MG tablet  Has the patient contacted their pharmacy? No (Agent: If no, request that the patient contact the pharmacy for the refill. If patient does not wish to contact the pharmacy document the reason why and proceed with request.) (Agent: If yes, when and what did the pharmacy advise?)  Is this the correct pharmacy for this prescription? Yes If no, delete pharmacy and type the correct one.  This is the patient's preferred pharmacy:  [facility] - [place], [place] - [address]  [place] [address] Phone: [phone] Fax: [phone]   Has the prescription been filled recently? Yes  Is the patient out of the medication? Yes  Has the patient been seen for an appointment in the last year OR does the patient have an upcoming appointment? Yes  Can we respond through MyChart? Yes  Agent: Please be advised that Rx refills may take up to 3 business days. We ask that you follow-up with your pharmacy.

## 2024-02-08 MED ORDER — CLOTRIMAZOLE 1 % EX CREA: 1.0000 | TOPICAL_CREAM | Freq: Two times a day (BID) | CUTANEOUS | 1 refills | Status: DC

## 2024-02-08 NOTE — Addendum Note (Signed)
 Addended by: DE Peru, Kasee Hantz J on: 02/08/2024 09:38 AM   Modules accepted: Orders

## 2024-02-11 ENCOUNTER — Ambulatory Visit (HOSPITAL_BASED_OUTPATIENT_CLINIC_OR_DEPARTMENT_OTHER): Payer: Medicaid Other | Admitting: Family Medicine

## 2024-02-15 ENCOUNTER — Ambulatory Visit (HOSPITAL_BASED_OUTPATIENT_CLINIC_OR_DEPARTMENT_OTHER): Payer: Medicaid Other | Admitting: Family Medicine

## 2024-02-15 ENCOUNTER — Encounter (HOSPITAL_BASED_OUTPATIENT_CLINIC_OR_DEPARTMENT_OTHER): Payer: Self-pay | Admitting: Family Medicine

## 2024-02-15 VITALS — BP 156/84 | HR 80 | Ht 65.0 in | Wt 96.1 lb

## 2024-02-15 DIAGNOSIS — L210 Seborrhea capitis: Secondary | ICD-10-CM

## 2024-02-15 DIAGNOSIS — F419 Anxiety disorder, unspecified: Secondary | ICD-10-CM

## 2024-02-15 DIAGNOSIS — R21 Rash and other nonspecific skin eruption: Secondary | ICD-10-CM | POA: Diagnosis not present

## 2024-02-15 DIAGNOSIS — Z124 Encounter for screening for malignant neoplasm of cervix: Secondary | ICD-10-CM | POA: Diagnosis not present

## 2024-02-15 MED ORDER — DIAZEPAM 5 MG PO TABS: 2.5000 mg | ORAL_TABLET | Freq: Four times a day (QID) | ORAL | 1 refills | Status: AC | PRN

## 2024-02-15 MED ORDER — CLOTRIMAZOLE 1 % EX CREA: 1.0000 | TOPICAL_CREAM | Freq: Two times a day (BID) | CUTANEOUS | 1 refills | Status: AC

## 2024-02-15 MED ORDER — KETOCONAZOLE 2 % EX SHAM: MEDICATED_SHAMPOO | CUTANEOUS | 1 refills | Status: DC

## 2024-02-15 NOTE — Patient Instructions (Signed)
  Medication Instructions:  Your physician recommends that you continue on your current medications as directed. Please refer to the Current Medication list given to you today. --If you need a refill on any your medications before your next appointment, please call your pharmacy first. If no refills are authorized on file call the office.--   Follow-Up: Your next appointment:   Your physician recommends that you schedule a follow-up appointment in: 2 month follow up  with Dr. de [place]  You will receive a text message or e-mail with a link to a survey about your care and experience with [place] today! We would greatly appreciate your feedback!   Thanks for letting us be apart of your health journey!!  Primary Care and Sports Medicine   Dr. Ceasar Mons [place]   We encourage you to activate your patient portal called "MyChart".  Sign up information is provided on this After Visit Summary.  MyChart is used to connect with patients for Virtual Visits (Telemedicine).  Patients are able to view lab/test results, encounter notes, upcoming appointments, etc.  Non-urgent messages can be sent to your provider as well. To learn more about what you can do with MyChart, please visit --  [email or web address].

## 2024-02-15 NOTE — Progress Notes (Unsigned)
    Procedures performed today:    None.  Independent interpretation of notes and tests performed by another provider:   None.  Brief History, Exam, Impression, and Recommendations:    BP (!) 164/84 (BP Location: Right Arm, Patient Position: Sitting, Cuff Size: Normal)   Pulse 80   Ht 5\' 5"  (1.651 m)   Wt 96 lb 1.6 oz (43.6 kg)   SpO2 100%   BMI 15.99 kg/m   There are no diagnoses linked to this encounter.No follow-ups on file.   ___________________________________________ Carry Ortez de [place], MD, ABFM, [facility] Primary Care and Sports Medicine [facility]

## 2024-02-16 DIAGNOSIS — F419 Anxiety disorder, unspecified: Secondary | ICD-10-CM | POA: Insufficient documentation

## 2024-02-16 NOTE — Assessment & Plan Note (Signed)
 Patient reports being prescribed Valium in the past for use related to anxiety, occasional sleep issues.  She is requesting refill today.  She denies experiencing any side effects from medication in the past.  Reports that she uses this infrequently.  PDMP reviewed today which does show prior prescriptions, last prescription filled was in April 2024 which would suggest relatively infrequent use of medication. Did discuss that utilizing Valium is not optimal in regards to sleep management, however is reasonable related to anxiety concerns.  Discussed potential risks with this class of medication.  Refill sent to pharmacy.

## 2024-02-16 NOTE — Assessment & Plan Note (Addendum)
 Patient notes improvement in rash since utilizing new topical antifungal.  Still has some symptoms currently but does report improvement.  She indicates about 50% improvement since before starting current topical antifungal.  She will still have some itching and discomfort at times.  She was referred to dermatology, however indicates that she did not hear from the office. Given improvement thus far, can continue with topical antifungal, refill sent to pharmacy.  It does appear that dermatology office attempted to contact patient, however voicemail box was full.  Provided patient with contact information for dermatology office today so that she may call to schedule appointment.  She additionally would be interested in establishing with OB/GYN, she plans to schedule appointment with [place] providers [place].  Discussed expectation for continued gradual improvement in symptoms with gradual resolution over the coming weeks.

## 2024-03-30 ENCOUNTER — Other Ambulatory Visit (HOSPITAL_BASED_OUTPATIENT_CLINIC_OR_DEPARTMENT_OTHER): Payer: Self-pay | Admitting: Family Medicine

## 2024-03-30 DIAGNOSIS — L210 Seborrhea capitis: Secondary | ICD-10-CM

## 2024-03-30 MED ORDER — KETOCONAZOLE 2 % EX SHAM: MEDICATED_SHAMPOO | CUTANEOUS | 1 refills | Status: AC

## 2024-03-30 NOTE — Telephone Encounter (Signed)
 Copied from CRM [id number]. Topic: Clinical - Medication Refill >> Mar 30, 2024  9:52 AM Shon Hale wrote: Most Recent Primary Care Visit:  Provider: DE [place], RAYMOND J  Department: [facility]  Visit Type: ACUTE  Date: 02/15/2024  Medication: ketoconazole (NIZORAL) 2 % shampoo Requesting refill until next appointment.   Has the patient contacted their pharmacy? Yes Out of refills  Is this the correct pharmacy for this prescription? Yes This is the patient's preferred pharmacy:  [facility] - [place], [place] - [address]  [place] [address] Phone: [phone] Fax: [phone]   Has the prescription been filled recently? No  Is the patient out of the medication? Yes  Has the patient been seen for an appointment in the last year OR does the patient have an upcoming appointment? Yes  Can we respond through MyChart? No, prefers a phone call [phone]  Agent: Please be advised that Rx refills may take up to 3 business days. We ask that you follow-up with your pharmacy.

## 2024-04-14 ENCOUNTER — Ambulatory Visit (HOSPITAL_BASED_OUTPATIENT_CLINIC_OR_DEPARTMENT_OTHER): Payer: Medicaid Other | Admitting: Family Medicine
